# Patient Record
Sex: Male | Born: 1965 | Race: White | Hispanic: No | Marital: Married | Smoking: Former smoker
Health system: Southern US, Community
[De-identification: ages and names within clinical notes are randomized; demographics above are authoritative.]

## PROBLEM LIST (undated history)

## (undated) DIAGNOSIS — E119 Type 2 diabetes mellitus without complications: Secondary | ICD-10-CM

## (undated) DIAGNOSIS — K219 Gastro-esophageal reflux disease without esophagitis: Secondary | ICD-10-CM

## (undated) DIAGNOSIS — J189 Pneumonia, unspecified organism: Secondary | ICD-10-CM

## (undated) DIAGNOSIS — I10 Essential (primary) hypertension: Secondary | ICD-10-CM

## (undated) HISTORY — PX: ANTERIOR FUSION CERVICAL SPINE: SUR626

## (undated) HISTORY — PX: ROTATOR CUFF REPAIR: SHX139

## (undated) HISTORY — PX: COLONOSCOPY: SHX174

## (undated) HISTORY — PX: BACK SURGERY: SHX140

## (undated) HISTORY — PX: TONSILLECTOMY: SUR1361

## (undated) HISTORY — PX: ELBOW SURGERY: SHX618

---

## 2011-07-26 DIAGNOSIS — J189 Pneumonia, unspecified organism: Secondary | ICD-10-CM

## 2011-07-26 HISTORY — DX: Pneumonia, unspecified organism: J18.9

## 2014-04-09 ENCOUNTER — Ambulatory Visit: Payer: Self-pay | Admitting: Physician Assistant

## 2015-08-31 ENCOUNTER — Other Ambulatory Visit: Payer: Self-pay | Admitting: Physician Assistant

## 2015-08-31 NOTE — H&P (Addendum)
This is a 50 year-old gentleman who presents to our clinic today for follow up of his left shoulder.  Lee Kramer has been experiencing marked pain to the left shoulder for the past six weeks.  We have worked him up for rotator cuff repair with an MR arthrogram.  This showed no obvious gross destruction of his cuff, however there was one film that looked like he may have a small leak of fluid through a potentially small hole in his rotator cuff.  Lee Kramer has failed conservative treatment options to include oral anti-inflammatories, subacromial injection, as well as a home exercise program.  He is ready to proceed with definitive treatment options. Past medical, social and family history reviewed in detail on the patient questionnaire and signed.  Review of systems: As detailed in HPI.  All others reviewed and are negative.   EXAMINATION: Well-developed, well-nourished male in no acute distress.  Lungs clear to auscultation bilaterally.  Heart sounds normal.  Alert and oriented x 3.  Examination of his left shoulder reveals full active range of motion with forward flexion.  Limited external and internal rotation.  He is neurovascularly intact distally.    IMPRESSION: Left shoulder impingement syndrome.   PLAN: At this point Lee Kramer has tried all conservative treatment options which have all failed.  He would like to proceed with definitive treatment to include a left shoulder arthroscopic decompression and possible rotator cuff repair.  Risks, benefits and possible complications of surgery have been reviewed.  Rehab and recovery time discussed. All questions answered.  Paperwork complete.    Loreta Ave, M.D.

## 2015-09-03 ENCOUNTER — Encounter (HOSPITAL_BASED_OUTPATIENT_CLINIC_OR_DEPARTMENT_OTHER): Payer: Self-pay | Admitting: *Deleted

## 2015-09-07 ENCOUNTER — Other Ambulatory Visit: Payer: Self-pay

## 2015-09-07 ENCOUNTER — Encounter (HOSPITAL_BASED_OUTPATIENT_CLINIC_OR_DEPARTMENT_OTHER)
Admission: RE | Admit: 2015-09-07 | Discharge: 2015-09-07 | Disposition: A | Discharge status: Home / Self Care | Payer: 59 | Source: Ambulatory Visit | Attending: Orthopedic Surgery | Admitting: Orthopedic Surgery

## 2015-09-07 DIAGNOSIS — Z7982 Long term (current) use of aspirin: Secondary | ICD-10-CM | POA: Diagnosis not present

## 2015-09-07 DIAGNOSIS — Z79899 Other long term (current) drug therapy: Secondary | ICD-10-CM | POA: Diagnosis not present

## 2015-09-07 DIAGNOSIS — K219 Gastro-esophageal reflux disease without esophagitis: Secondary | ICD-10-CM | POA: Diagnosis not present

## 2015-09-07 DIAGNOSIS — M24112 Other articular cartilage disorders, left shoulder: Secondary | ICD-10-CM | POA: Diagnosis not present

## 2015-09-07 DIAGNOSIS — E119 Type 2 diabetes mellitus without complications: Secondary | ICD-10-CM | POA: Diagnosis not present

## 2015-09-07 DIAGNOSIS — I10 Essential (primary) hypertension: Secondary | ICD-10-CM | POA: Diagnosis not present

## 2015-09-07 DIAGNOSIS — M7542 Impingement syndrome of left shoulder: Secondary | ICD-10-CM | POA: Diagnosis not present

## 2015-09-07 DIAGNOSIS — M19012 Primary osteoarthritis, left shoulder: Secondary | ICD-10-CM | POA: Diagnosis present

## 2015-09-07 DIAGNOSIS — M7502 Adhesive capsulitis of left shoulder: Secondary | ICD-10-CM | POA: Diagnosis not present

## 2015-09-07 LAB — BASIC METABOLIC PANEL
Anion gap: 13 (ref 5–15)
BUN: 13 mg/dL (ref 6–20)
CALCIUM: 9.4 mg/dL (ref 8.9–10.3)
CO2: 24 mmol/L (ref 22–32)
CREATININE: 1.01 mg/dL (ref 0.61–1.24)
Chloride: 102 mmol/L (ref 101–111)
GFR calc Af Amer: >60 mL/min (ref >60)
Glucose, Bld: 122 mg/dL — ABNORMAL HIGH (ref 65–99)
POTASSIUM: 4.7 mmol/L (ref 3.5–5.1)
SODIUM: 139 mmol/L (ref 135–145)

## 2015-09-10 ENCOUNTER — Ambulatory Visit (HOSPITAL_BASED_OUTPATIENT_CLINIC_OR_DEPARTMENT_OTHER): Payer: 59 | Admitting: Certified Registered"

## 2015-09-10 ENCOUNTER — Encounter (HOSPITAL_BASED_OUTPATIENT_CLINIC_OR_DEPARTMENT_OTHER)
Admission: RE | Disposition: A | Discharge status: Home / Self Care | Payer: Self-pay | Source: Ambulatory Visit | Attending: Orthopedic Surgery

## 2015-09-10 ENCOUNTER — Ambulatory Visit (HOSPITAL_BASED_OUTPATIENT_CLINIC_OR_DEPARTMENT_OTHER)
Admission: RE | Admit: 2015-09-10 | Discharge: 2015-09-10 | Disposition: A | Discharge status: Home / Self Care | Payer: 59 | Source: Ambulatory Visit | Attending: Orthopedic Surgery | Admitting: Orthopedic Surgery

## 2015-09-10 ENCOUNTER — Encounter (HOSPITAL_BASED_OUTPATIENT_CLINIC_OR_DEPARTMENT_OTHER): Payer: Self-pay

## 2015-09-10 DIAGNOSIS — Z7982 Long term (current) use of aspirin: Secondary | ICD-10-CM | POA: Insufficient documentation

## 2015-09-10 DIAGNOSIS — M7502 Adhesive capsulitis of left shoulder: Secondary | ICD-10-CM | POA: Insufficient documentation

## 2015-09-10 DIAGNOSIS — E119 Type 2 diabetes mellitus without complications: Secondary | ICD-10-CM | POA: Insufficient documentation

## 2015-09-10 DIAGNOSIS — M19012 Primary osteoarthritis, left shoulder: Secondary | ICD-10-CM | POA: Insufficient documentation

## 2015-09-10 DIAGNOSIS — Z79899 Other long term (current) drug therapy: Secondary | ICD-10-CM | POA: Insufficient documentation

## 2015-09-10 DIAGNOSIS — M7542 Impingement syndrome of left shoulder: Secondary | ICD-10-CM | POA: Insufficient documentation

## 2015-09-10 DIAGNOSIS — K219 Gastro-esophageal reflux disease without esophagitis: Secondary | ICD-10-CM | POA: Insufficient documentation

## 2015-09-10 DIAGNOSIS — I10 Essential (primary) hypertension: Secondary | ICD-10-CM | POA: Insufficient documentation

## 2015-09-10 DIAGNOSIS — M24112 Other articular cartilage disorders, left shoulder: Secondary | ICD-10-CM | POA: Insufficient documentation

## 2015-09-10 HISTORY — DX: Gastro-esophageal reflux disease without esophagitis: K21.9

## 2015-09-10 HISTORY — DX: Type 2 diabetes mellitus without complications: E11.9

## 2015-09-10 HISTORY — PX: SHOULDER ARTHROSCOPY WITH DISTAL CLAVICLE RESECTION: SHX5675

## 2015-09-10 HISTORY — DX: Essential (primary) hypertension: I10

## 2015-09-10 LAB — GLUCOSE, CAPILLARY
GLUCOSE-CAPILLARY: 116 mg/dL — AB (ref 65–99)
GLUCOSE-CAPILLARY: 98 mg/dL (ref 65–99)

## 2015-09-10 SURGERY — SHOULDER ARTHROSCOPY WITH DISTAL CLAVICLE RESECTION
Anesthesia: Regional | Site: Shoulder | Laterality: Left

## 2015-09-10 MED ORDER — ONDANSETRON HCL 4 MG/2ML IJ SOLN
INTRAMUSCULAR | Status: DC | PRN
  Administered 2015-09-10: 4 mg via INTRAVENOUS

## 2015-09-10 MED ORDER — OXYCODONE-ACETAMINOPHEN 5-325 MG PO TABS: 1.0000 | ORAL_TABLET | ORAL | Status: DC | PRN

## 2015-09-10 MED ORDER — MIDAZOLAM HCL 2 MG/2ML IJ SOLN
INTRAMUSCULAR | Status: AC
  Filled 2015-09-10: qty 2

## 2015-09-10 MED ORDER — FENTANYL CITRATE (PF) 100 MCG/2ML IJ SOLN
INTRAMUSCULAR | Status: AC
  Filled 2015-09-10: qty 2

## 2015-09-10 MED ORDER — DEXAMETHASONE SODIUM PHOSPHATE 4 MG/ML IJ SOLN
INTRAMUSCULAR | Status: DC | PRN
  Administered 2015-09-10: 10 mg via INTRAVENOUS

## 2015-09-10 MED ORDER — HYDROMORPHONE HCL 1 MG/ML IJ SOLN
INTRAMUSCULAR | Status: AC
  Filled 2015-09-10: qty 1

## 2015-09-10 MED ORDER — DEXAMETHASONE SODIUM PHOSPHATE 10 MG/ML IJ SOLN
INTRAMUSCULAR | Status: AC
Start: 2015-09-10 — End: 2015-09-10
  Filled 2015-09-10: qty 1

## 2015-09-10 MED ORDER — HYDROMORPHONE HCL 1 MG/ML IJ SOLN
0.2500 mg | INTRAMUSCULAR | Status: DC | PRN
  Administered 2015-09-10 (×2): 0.5 mg via INTRAVENOUS

## 2015-09-10 MED ORDER — LIDOCAINE HCL (CARDIAC) 20 MG/ML IV SOLN
INTRAVENOUS | Status: AC
  Filled 2015-09-10: qty 5

## 2015-09-10 MED ORDER — SCOPOLAMINE 1 MG/3DAYS TD PT72: 1.0000 | MEDICATED_PATCH | Freq: Once | TRANSDERMAL | Status: DC | PRN

## 2015-09-10 MED ORDER — SUCCINYLCHOLINE CHLORIDE 20 MG/ML IJ SOLN
INTRAMUSCULAR | Status: DC | PRN
  Administered 2015-09-10: 100 mg via INTRAVENOUS

## 2015-09-10 MED ORDER — ONDANSETRON HCL 4 MG/2ML IJ SOLN
INTRAMUSCULAR | Status: AC
  Filled 2015-09-10: qty 2

## 2015-09-10 MED ORDER — EPHEDRINE SULFATE 50 MG/ML IJ SOLN
INTRAMUSCULAR | Status: DC | PRN
  Administered 2015-09-10: 15 mg via INTRAVENOUS
  Administered 2015-09-10: 10 mg via INTRAVENOUS

## 2015-09-10 MED ORDER — LACTATED RINGERS IV SOLN: INTRAVENOUS | Status: DC

## 2015-09-10 MED ORDER — LACTATED RINGERS IV SOLN
INTRAVENOUS | Status: DC
  Administered 2015-09-10: 12:00:00 via INTRAVENOUS

## 2015-09-10 MED ORDER — EPHEDRINE SULFATE 50 MG/ML IJ SOLN
INTRAMUSCULAR | Status: AC
  Filled 2015-09-10: qty 1

## 2015-09-10 MED ORDER — PROMETHAZINE HCL 25 MG/ML IJ SOLN: 6.2500 mg | INTRAMUSCULAR | Status: DC | PRN

## 2015-09-10 MED ORDER — CEFAZOLIN SODIUM-DEXTROSE 2-3 GM-% IV SOLR
INTRAVENOUS | Status: AC
  Filled 2015-09-10: qty 50

## 2015-09-10 MED ORDER — OXYCODONE HCL 5 MG PO TABS
ORAL_TABLET | ORAL | Status: AC
  Filled 2015-09-10: qty 1

## 2015-09-10 MED ORDER — GLYCOPYRROLATE 0.2 MG/ML IJ SOLN
0.2000 mg | Freq: Once | INTRAMUSCULAR | Status: DC | PRN
Start: 2015-09-10 — End: 2015-09-10

## 2015-09-10 MED ORDER — MIDAZOLAM HCL 2 MG/2ML IJ SOLN
1.0000 mg | INTRAMUSCULAR | Status: DC | PRN
  Administered 2015-09-10 (×2): 2 mg via INTRAVENOUS

## 2015-09-10 MED ORDER — CEFAZOLIN SODIUM-DEXTROSE 2-3 GM-% IV SOLR
2.0000 g | INTRAVENOUS | Status: AC
  Administered 2015-09-10: 2 g via INTRAVENOUS

## 2015-09-10 MED ORDER — MEPERIDINE HCL 25 MG/ML IJ SOLN: 6.2500 mg | INTRAMUSCULAR | Status: DC | PRN

## 2015-09-10 MED ORDER — LIDOCAINE HCL (CARDIAC) 20 MG/ML IV SOLN
INTRAVENOUS | Status: DC | PRN
  Administered 2015-09-10: 80 mg via INTRAVENOUS

## 2015-09-10 MED ORDER — ONDANSETRON HCL 4 MG PO TABS: 4.0000 mg | ORAL_TABLET | Freq: Three times a day (TID) | ORAL | Status: DC | PRN

## 2015-09-10 MED ORDER — CHLORHEXIDINE GLUCONATE 4 % EX LIQD: 60.0000 mL | Freq: Once | CUTANEOUS | Status: DC

## 2015-09-10 MED ORDER — PROPOFOL 10 MG/ML IV BOLUS
INTRAVENOUS | Status: DC | PRN
  Administered 2015-09-10: 200 mg via INTRAVENOUS
  Administered 2015-09-10: 40 mg via INTRAVENOUS

## 2015-09-10 MED ORDER — FENTANYL CITRATE (PF) 100 MCG/2ML IJ SOLN
50.0000 ug | INTRAMUSCULAR | Status: AC | PRN
  Administered 2015-09-10: 100 ug via INTRAVENOUS
  Administered 2015-09-10: 50 ug via INTRAVENOUS
  Administered 2015-09-10: 100 ug via INTRAVENOUS

## 2015-09-10 MED ORDER — BUPIVACAINE-EPINEPHRINE (PF) 0.5% -1:200000 IJ SOLN
INTRAMUSCULAR | Status: DC | PRN
  Administered 2015-09-10: 20 mL via PERINEURAL

## 2015-09-10 MED ORDER — OXYCODONE HCL 5 MG PO TABS
5.0000 mg | ORAL_TABLET | Freq: Once | ORAL | Status: AC
  Administered 2015-09-10: 5 mg via ORAL

## 2015-09-10 SURGICAL SUPPLY — 70 items
BENZOIN TINCTURE PRP APPL 2/3 (GAUZE/BANDAGES/DRESSINGS) IMPLANT
BLADE CUTTER GATOR 3.5 (BLADE) ×4 IMPLANT
BLADE CUTTER MENIS 5.5 (BLADE) IMPLANT
BLADE GREAT WHITE 4.2 (BLADE) ×3 IMPLANT
BLADE GREAT WHITE 4.2MM (BLADE) ×1
BLADE SURG 15 STRL LF DISP TIS (BLADE) ×2 IMPLANT
BLADE SURG 15 STRL SS (BLADE) ×2
BUR OVAL 6.0 (BURR) ×4 IMPLANT
CANNULA DRY DOC 8X75 (CANNULA) IMPLANT
CANNULA TWIST IN 8.25X7CM (CANNULA) IMPLANT
CLOSURE WOUND 1/2 X4 (GAUZE/BANDAGES/DRESSINGS)
DECANTER SPIKE VIAL GLASS SM (MISCELLANEOUS) IMPLANT
DRAPE STERI 35X30 U-POUCH (DRAPES) ×4 IMPLANT
DRAPE U-SHAPE 47X51 STRL (DRAPES) ×4 IMPLANT
DRAPE U-SHAPE 76X120 STRL (DRAPES) ×8 IMPLANT
DRSG PAD ABDOMINAL 8X10 ST (GAUZE/BANDAGES/DRESSINGS) ×4 IMPLANT
DURAPREP 26ML APPLICATOR (WOUND CARE) ×4 IMPLANT
ELECT MENISCUS 165MM 90D (ELECTRODE) ×4 IMPLANT
ELECT REM PT RETURN 9FT ADLT (ELECTROSURGICAL) ×4
ELECTRODE REM PT RTRN 9FT ADLT (ELECTROSURGICAL) ×2 IMPLANT
GAUZE SPONGE 4X4 12PLY STRL (GAUZE/BANDAGES/DRESSINGS) ×8 IMPLANT
GAUZE XEROFORM 1X8 LF (GAUZE/BANDAGES/DRESSINGS) ×4 IMPLANT
GLOVE BIO SURGEON STRL SZ 6.5 (GLOVE) ×3 IMPLANT
GLOVE BIO SURGEONS STRL SZ 6.5 (GLOVE) ×1
GLOVE BIOGEL PI IND STRL 7.0 (GLOVE) ×6 IMPLANT
GLOVE BIOGEL PI INDICATOR 7.0 (GLOVE) ×6
GLOVE ECLIPSE 7.0 STRL STRAW (GLOVE) ×4 IMPLANT
GLOVE SURG ORTHO 8.0 STRL STRW (GLOVE) ×4 IMPLANT
GOWN STRL REUS W/ TWL LRG LVL3 (GOWN DISPOSABLE) ×4 IMPLANT
GOWN STRL REUS W/ TWL XL LVL3 (GOWN DISPOSABLE) ×2 IMPLANT
GOWN STRL REUS W/TWL LRG LVL3 (GOWN DISPOSABLE) ×4
GOWN STRL REUS W/TWL XL LVL3 (GOWN DISPOSABLE) ×2
IV NS IRRIG 3000ML ARTHROMATIC (IV SOLUTION) ×16 IMPLANT
MANIFOLD NEPTUNE II (INSTRUMENTS) ×4 IMPLANT
NDL SUT 6 .5 CRC .975X.05 MAYO (NEEDLE) IMPLANT
NEEDLE MAYO TAPER (NEEDLE)
NEEDLE SCORPION MULTI FIRE (NEEDLE) IMPLANT
NS IRRIG 1000ML POUR BTL (IV SOLUTION) IMPLANT
PACK ARTHROSCOPY DSU (CUSTOM PROCEDURE TRAY) ×4 IMPLANT
PASSER SUT SWANSON 36MM LOOP (INSTRUMENTS) IMPLANT
PENCIL BUTTON HOLSTER BLD 10FT (ELECTRODE) ×4 IMPLANT
SET ARTHROSCOPY TUBING (MISCELLANEOUS) ×2
SET ARTHROSCOPY TUBING LN (MISCELLANEOUS) ×2 IMPLANT
SLEEVE SCD COMPRESS KNEE MED (MISCELLANEOUS) IMPLANT
SLING ARM FOAM STRAP LRG (SOFTGOODS) IMPLANT
SLING ARM IMMOBILIZER LRG (SOFTGOODS) IMPLANT
SLING ARM IMMOBILIZER MED (SOFTGOODS) IMPLANT
SLING ARM MED ADULT FOAM STRAP (SOFTGOODS) IMPLANT
SLING ARM XL FOAM STRAP (SOFTGOODS) IMPLANT
SPONGE LAP 4X18 X RAY DECT (DISPOSABLE) IMPLANT
STRIP CLOSURE SKIN 1/2X4 (GAUZE/BANDAGES/DRESSINGS) IMPLANT
SUCTION FRAZIER HANDLE 10FR (MISCELLANEOUS)
SUCTION TUBE FRAZIER 10FR DISP (MISCELLANEOUS) IMPLANT
SUT ETHIBOND 2 OS 4 DA (SUTURE) IMPLANT
SUT ETHILON 2 0 FS 18 (SUTURE) IMPLANT
SUT ETHILON 3 0 PS 1 (SUTURE) IMPLANT
SUT FIBERWIRE #2 38 T-5 BLUE (SUTURE)
SUT RETRIEVER MED (INSTRUMENTS) IMPLANT
SUT TIGER TAPE 7 IN WHITE (SUTURE) IMPLANT
SUT VIC AB 0 CT1 27 (SUTURE)
SUT VIC AB 0 CT1 27XBRD ANBCTR (SUTURE) IMPLANT
SUT VIC AB 2-0 SH 27 (SUTURE)
SUT VIC AB 2-0 SH 27XBRD (SUTURE) IMPLANT
SUT VIC AB 3-0 FS2 27 (SUTURE) IMPLANT
SUTURE FIBERWR #2 38 T-5 BLUE (SUTURE) IMPLANT
TAPE FIBER 2MM 7IN #2 BLUE (SUTURE) IMPLANT
TOWEL OR 17X24 6PK STRL BLUE (TOWEL DISPOSABLE) ×4 IMPLANT
TOWEL OR NON WOVEN STRL DISP B (DISPOSABLE) ×4 IMPLANT
WATER STERILE IRR 1000ML POUR (IV SOLUTION) ×4 IMPLANT
YANKAUER SUCT BULB TIP NO VENT (SUCTIONS) IMPLANT

## 2015-09-10 NOTE — Anesthesia Preprocedure Evaluation (Addendum)
Anesthesia Evaluation  Patient identified by MRN, date of birth, ID band Patient awake    Reviewed: Allergy & Precautions, NPO status , Patient's Chart, lab work & pertinent test results  Airway Mallampati: II  TM Distance: >3 FB Neck ROM: Limited    Dental no notable dental hx.    Pulmonary neg pulmonary ROS,    Pulmonary exam normal breath sounds clear to auscultation       Cardiovascular hypertension, Pt. on medications Normal cardiovascular exam Rhythm:Regular Rate:Normal     Neuro/Psych  Neuromuscular disease negative psych ROS   GI/Hepatic Neg liver ROS, GERD  Medicated,  Endo/Other  diabetes, Type 2, Oral Hypoglycemic Agents  Renal/GU negative Renal ROS  negative genitourinary   Musculoskeletal negative musculoskeletal ROS (+)   Abdominal   Peds negative pediatric ROS (+)  Hematology negative hematology ROS (+)   Anesthesia Other Findings   Reproductive/Obstetrics negative OB ROS                             Anesthesia Physical Anesthesia Plan  ASA: II  Anesthesia Plan: General and Regional   Post-op Pain Management: GA combined w/ Regional for post-op pain   Induction: Intravenous  Airway Management Planned: Oral ETT  Additional Equipment:   Intra-op Plan:   Post-operative Plan: Extubation in OR  Informed Consent: I have reviewed the patients History and Physical, chart, labs and discussed the procedure including the risks, benefits and alternatives for the proposed anesthesia with the patient or authorized representative who has indicated his/her understanding and acceptance.   Dental advisory given  Plan Discussed with: CRNA  Anesthesia Plan Comments:         Anesthesia Quick Evaluation

## 2015-09-10 NOTE — Transfer of Care (Signed)
Immediate Anesthesia Transfer of Care Note  Patient: Lee Kramer  Procedure(s) Performed: Procedure(s): SHOULDER ARTHROSCOPY WITH DISTAL CLAVICLE RESECTION DEBRIDE LABRUM DEBRIDEMENT CALCIFIC ROTATOR CUFF (Left)  Patient Location: PACU  Anesthesia Type:General  Level of Consciousness: awake and sedated  Airway & Oxygen Therapy: Patient Spontanous Breathing and Patient connected to face mask oxygen  Post-op Assessment: Report given to RN and Post -op Vital signs reviewed and stable  Post vital signs: Reviewed and stable  Last Vitals:  Filed Vitals:   09/10/15 1225 09/10/15 1230  BP:    Pulse: 102 94  Temp:    Resp: 15 17    Complications: No apparent anesthesia complications

## 2015-09-10 NOTE — Interval H&P Note (Signed)
History and Physical Interval Note:  09/10/2015 7:34 AM  Lee Kramer  has presented today for surgery, with the diagnosis of LEFT SHOULDER OSTEOARTRITIS IMPINGEMENT SYNDROME ,STAIN OF MUSCLES AND TENDON OF THE  ROTATOR CUFF  The various methods of treatment have been discussed with the patient and family. After consideration of risks, benefits and other options for treatment, the patient has consented to  Procedure(s): LEFT SHOULDER ARTHROSCOPY DEBRIDMENT, DISTAL CLAVICAL EXCISION, ACROMIOPLASTY,ROTATOR CUFF REPAIR,BICEPS TENODESIS  (Left) as a surgical intervention .  The patient's history has been reviewed, patient examined, no change in status, stable for surgery.  I have reviewed the patient's chart and labs.  Questions were answered to the patient's satisfaction.     Loreta Ave

## 2015-09-10 NOTE — Discharge Instructions (Signed)
Shouder arthroscopy, partial rotator cuff tear debridement subacromial decompression °Care After Instructions °Refer to this sheet in the next few weeks. These discharge instructions provide you with general information on caring for yourself after you leave the hospital. Your caregiver may also give you specific instructions. Your treatment has been planned according to the most current medical practices available, but unavoidable complications sometimes occur. If you have any problems or questions after discharge, please call your caregiver. °HOME INSTRUCTIONS °You may resume a normal diet and activities as directed. Take showers instead of baths until informed otherwise.  °Change bandages (dressings) in 3 days.  Swab wounds daily with betadine.  Wash shoulder with soap and water.  Pat dry.  Cover wounds with bandaids. °Only take over-the-counter or prescription medicines for pain, discomfort, or fever as directed by your caregiver.  °Wear your sling for the next 2 days unless otherwise instructed. °Eat a well-balanced diet.  °Avoid lifting or driving until you are instructed otherwise.  °Make an appointment to see your caregiver for stitches (suture) or staple removal one week after surgery. ° °SEEK MEDICAL CARE IF: °You have swelling of your calf or leg.  °You develop shortness of breath or chest pain.  °You have redness, swelling, or increasing pain in the wound.  °There is pus or any unusual drainage coming from the surgical site.  °You notice a bad smell coming from the surgical site or dressing.  °The surgical site breaks open after sutures or staples have been removed.  °There is persistent bleeding from the suture or staple line.  °You are getting worse or are not improving.  °You have any other questions or concerns.  °SEEK IMMEDIATE MEDICAL CARE IF:  °You have a fever greater than 101 °You develop a rash.  °You have difficulty breathing.  °You develop any reaction or side effects to medicines given.    °Your knee motion is decreasing rather than improving.  °MAKE SURE YOU:  °Understand these instructions.  °Will watch your condition.  °Will get help right away if you are not doing well or get worse.  ° °Post Anesthesia Home Care Instructions ° °Activity: °Get plenty of rest for the remainder of the day. A responsible adult should stay with you for 24 hours following the procedure.  °For the next 24 hours, DO NOT: °-Drive a car °-Operate machinery °-Drink alcoholic beverages °-Take any medication unless instructed by your physician °-Make any legal decisions or sign important papers. ° °Meals: °Start with liquid foods such as gelatin or soup. Progress to regular foods as tolerated. Avoid greasy, spicy, heavy foods. If nausea and/or vomiting occur, drink only clear liquids until the nausea and/or vomiting subsides. Call your physician if vomiting continues. ° °Special Instructions/Symptoms: °Your throat may feel dry or sore from the anesthesia or the breathing tube placed in your throat during surgery. If this causes discomfort, gargle with warm salt water. The discomfort should disappear within 24 hours. ° °If you had a scopolamine patch placed behind your ear for the management of post- operative nausea and/or vomiting: ° °1. The medication in the patch is effective for 72 hours, after which it should be removed.  Wrap patch in a tissue and discard in the trash. Wash hands thoroughly with soap and water. °2. You may remove the patch earlier than 72 hours if you experience unpleasant side effects which may include dry mouth, dizziness or visual disturbances. °3. Avoid touching the patch. Wash your hands with soap and water after contact with   the patch. ° ° °  °Regional Anesthesia Blocks ° °1. Numbness or the inability to move the "blocked" extremity may last from 3-48 hours after placement. The length of time depends on the medication injected and your individual response to the medication. If the numbness is not  going away after 48 hours, call your surgeon. ° °2. The extremity that is blocked will need to be protected until the numbness is gone and the  Strength has returned. Because you cannot feel it, you will need to take extra care to avoid injury. Because it may be weak, you may have difficulty moving it or using it. You may not know what position it is in without looking at it while the block is in effect. ° °3. For blocks in the legs and feet, returning to weight bearing and walking needs to be done carefully. You will need to wait until the numbness is entirely gone and the strength has returned. You should be able to move your leg and foot normally before you try and bear weight or walk. You will need someone to be with you when you first try to ensure you do not fall and possibly risk injury. ° °4. Bruising and tenderness at the needle site are common side effects and will resolve in a few days. ° °5. Persistent numbness or new problems with movement should be communicated to the surgeon or the [facility] ([phone])/ [facility] ([phone]). °

## 2015-09-10 NOTE — Anesthesia Procedure Notes (Addendum)
Anesthesia Regional Block:  Supraclavicular block  Pre-Anesthetic Checklist: ,, timeout performed, Correct Patient, Correct Site, Correct Laterality, Correct Procedure, Correct Position, site marked, Risks and benefits discussed,  Surgical consent,  Pre-op evaluation,  At surgeon's request and post-op pain management  Laterality: Left and Upper  Prep: Dura Prep       Needles:  Injection technique: Single-shot  Needle Type: Echogenic Stimulator Needle     Needle Length: 9cm 9 cm Needle Gauge: 21 and 21 G    Additional Needles:  Procedures: ultrasound guided (picture in chart) and nerve stimulator Supraclavicular block Narrative:  Injection made incrementally with aspirations every 5 mL.  Performed by: Personally   Additional Notes: Risks, benefits and alternative to block explained extensively.  Patient tolerated procedure well, without complications.   Procedure Name: Intubation Performed by: York Grice Pre-anesthesia Checklist: Patient identified, Emergency Drugs available, Suction available and Patient being monitored Patient Re-evaluated:Patient Re-evaluated prior to inductionOxygen Delivery Method: Circle System Utilized Preoxygenation: Pre-oxygenation with 100% oxygen Intubation Type: IV induction Ventilation: Mask ventilation without difficulty Laryngoscope Size: Miller and 2 Grade View: Grade II Tube type: Oral Tube size: 7.0 mm Number of attempts: 1 Airway Equipment and Method: Stylet and Oral airway Placement Confirmation: ETT inserted through vocal cords under direct vision,  positive ETCO2 and breath sounds checked- equal and bilateral Secured at: 22 cm Tube secured with: Tape Dental Injury: Teeth and Oropharynx as per pre-operative assessment

## 2015-09-10 NOTE — Anesthesia Postprocedure Evaluation (Signed)
Anesthesia Post Note  Patient: Lee Kramer  Procedure(s) Performed: Procedure(s) (LRB): SHOULDER ARTHROSCOPY WITH DISTAL CLAVICLE RESECTION DEBRIDE LABRUM DEBRIDEMENT CALCIFIC ROTATOR CUFF (Left)  Patient location during evaluation: PACU Anesthesia Type: General and Regional Level of consciousness: awake and alert Pain management: pain level controlled Vital Signs Assessment: post-procedure vital signs reviewed and stable Respiratory status: spontaneous breathing, nonlabored ventilation, respiratory function stable and patient connected to nasal cannula oxygen Cardiovascular status: blood pressure returned to baseline and stable Postop Assessment: no signs of nausea or vomiting Anesthetic complications: no    Last Vitals:  Filed Vitals:   09/10/15 1430 09/10/15 1445  BP: 113/77 115/64  Pulse: 104 98  Temp:    Resp: 21 17    Last Pain:  Filed Vitals:   09/10/15 1455  PainSc: 4                  Phillips Grout

## 2015-09-11 ENCOUNTER — Encounter (HOSPITAL_BASED_OUTPATIENT_CLINIC_OR_DEPARTMENT_OTHER): Payer: Self-pay | Admitting: Orthopedic Surgery

## 2015-09-11 NOTE — Op Note (Signed)
Lee Kramer, Lee Kramer                ACCOUNT NO.:  [id number]  MEDICAL RECORD NO.:  [id number]  LOCATION:                                 FACILITY:  PHYSICIAN:  Loreta Ave, M.D. DATE OF BIRTH:  1966/03/04  DATE OF PROCEDURE:  09/10/2015 DATE OF DISCHARGE:                              OPERATIVE REPORT   PREOPERATIVE DIAGNOSES:  Left shoulder recurrent impingement after remote rotator cuff repair done in a mini-open manner 20 years ago. Degenerative joint disease, acromioclavicular joint.  POSTOPERATIVE DIAGNOSES:  Left shoulder recurrent impingement after remote rotator cuff repair done in a mini-open manner 20 years ago. Degenerative joint disease, acromioclavicular joint without recurrent full-thickness tear, but calcific deposits on the top of rotator cuff. Degenerative tearing superior labrum.  Marked recurrent subacromial spurring and grade 4 changes to acromioclavicular joint.  Reactive adhesive bursitis subacromially.  PROCEDURE:  Left shoulder exam under anesthesia, arthroscopy. Debridement of labrum.  Debridement of rotator cuff above and below excising calcific deposits.  Bursectomy, lysis of adhesions. Acromioplasty, CA ligament release.  Excision of distal clavicle.  SURGEON:  Loreta Ave, MD  ASSISTANT:  Mikey Kirschner, PA, present throughout the entire case and necessary for timely completion of procedure.  ANESTHESIA:  General.  BLOOD LOSS:  Minimal.  SPECIMENS:  None.  CULTURES:  None.  COMPLICATIONS:  None.  DRESSINGS:  Soft compressive with sling.  PROCEDURE IN DETAIL:  The patient was brought to operating room, placed on the operating table in supine position.  After adequate anesthesia had been obtained, shoulder examined.  Full motion with stable shoulder. Placed in beach-chair position on the shoulder positioner, prepped and draped in usual sterile fashion.  Three portals anterior, posterior, and lateral.  Arthroscope introduced,  shoulder distended and inspected. Complex tearing, top of the labrum debrided.  Articular cartilage, biceps tendon, biceps anchor intact.  A lot of tendinopathy, partial tearing, degenerative change supraspinatus throughout the crescent region.  This was debrided.  No full-thickness tears.  Cannula redirected subacromially.  A lot of adhesions bursitis resected. Although, a lot of tendinopathy on the top of the cuff, there were no full-thickness tears.  Calcific deposits were debrided from the top of the cuff.  Marked recurrent spurring with a spike opted for the acromion.  Revision acromioplasty to a type 1 acromion releasing the CA ligament.  Distal clavicle grade 4 changes with significant spurring. Periarticular spurs, lateral centimeter of clavicle resected.  After completing decompression, I looked to the adequacy from all portals. This was found to be adequate.  I also then thoroughly inspected the cuff from all angles and again there was no indication to do more formal repair.  Instruments and fluid were removed.  Portals were closed with nylon.  Sterile compressive dressing applied.  Sling applied. Anesthesia reversed.  Brought to the recovery room.  Tolerated the surgery well.  No complications.     Loreta Ave, M.D.     DFM/MEDQ  D:  09/10/2015  T:  09/10/2015  Job:  [id number]

## 2016-06-03 ENCOUNTER — Encounter: Payer: Self-pay | Admitting: Gynecology

## 2016-06-03 ENCOUNTER — Ambulatory Visit
Admission: EM | Admit: 2016-06-03 | Discharge: 2016-06-03 | Disposition: A | Discharge status: Home / Self Care | Payer: 59 | Attending: Family Medicine | Admitting: Family Medicine

## 2016-06-03 DIAGNOSIS — L509 Urticaria, unspecified: Secondary | ICD-10-CM | POA: Diagnosis not present

## 2016-06-03 MED ORDER — FAMOTIDINE 20 MG PO TABS
20.0000 mg | ORAL_TABLET | Freq: Once | ORAL | Status: AC
  Administered 2016-06-03: 20 mg via ORAL

## 2016-06-03 MED ORDER — HYDROXYZINE HCL 25 MG PO TABS: ORAL_TABLET | ORAL | 0 refills | Status: DC

## 2016-06-03 NOTE — ED Provider Notes (Signed)
CSN: [id number]     Arrival date & time 06/03/16  1514 History   First MD Initiated Contact with Patient 06/03/16 1720     Chief Complaint  Patient presents with  . Rash   (Consider location/radiation/quality/duration/timing/severity/associated sxs/prior Treatment) HPI  This is a 50 year old male presents with a rash that he has had over 2 weeks. He states that he saw his primary care physician 2 weeks ago started on oral prednisone which she finished and states that it did not seem to help a topical steroid that he used on his facial rash after a week seem to help clear . Now he is having more severe itching which is continuous and a rash that seems to be involving most of his body. Has a hive appearance. it is interfering greatly with his sleep. He estimates he's only had 2 hours of sleep in the last couple of days because of the severe itching. No respiratory involvement. Is on lisinopril but has been taking that for at least 4 years without incident.      Past Medical History:  Diagnosis Date  . Diabetes mellitus without complication (HCC)   . GERD (gastroesophageal reflux disease)   . Hypertension   . Neuromuscular disorder [facility](HCC)    Past Surgical History:  Procedure Laterality Date  . ANTERIOR FUSION CERVICAL SPINE    . BACK SURGERY    . ELBOW SURGERY    . ROTATOR CUFF REPAIR    . SHOULDER ARTHROSCOPY WITH DISTAL CLAVICLE RESECTION Left 09/10/2015   Procedure: SHOULDER ARTHROSCOPY WITH DISTAL CLAVICLE RESECTION DEBRIDE LABRUM DEBRIDEMENT CALCIFIC ROTATOR CUFF;  Surgeon: Loreta Aveaniel F Murphy, MD;  Location: [facility];  Service: Orthopedics;  Laterality: Left;   Family History  Problem Relation Age of Onset  . Adopted: Yes   Social History  Substance Use Topics  . Smoking status: Never Smoker  . Smokeless tobacco: Never Used  . Alcohol use Yes     Comment: occas    Review of Systems  Constitutional: Negative for chills, fatigue and fever.  Skin: Positive for  rash.  All other systems reviewed and are negative.   Allergies  Patient has no known allergies.  Home Medications   Prior to Admission medications   Medication Sig Start Date End Date Taking? Authorizing Provider  aspirin 81 MG tablet Take 81 mg by mouth daily.   Yes Historical Provider, MD  atorvastatin (LIPITOR) 10 MG tablet Take 10 mg by mouth daily.   Yes Historical Provider, MD  lisinopril (PRINIVIL,ZESTRIL) 20 MG tablet Take 20 mg by mouth daily.   Yes Historical Provider, MD  metFORMIN (GLUCOPHAGE) 500 MG tablet Take by mouth 2 (two) times daily with a meal.   Yes Historical Provider, MD  omeprazole (PRILOSEC) 20 MG capsule Take 20 mg by mouth daily.   Yes Historical Provider, MD  hydrOXYzine (ATARAX/VISTARIL) 25 MG tablet Take 1-2 tablets times a day as necessary for severe itching 06/03/16   Lutricia FeilWilliam P Landry Kamath, PA-C  ondansetron (ZOFRAN) 4 MG tablet Take 1 tablet (4 mg total) by mouth every 8 (eight) hours as needed for nausea or vomiting. 09/10/15   Cristie HemMary L Stanbery, PA-C  oxyCODONE-acetaminophen (ROXICET) 5-325 MG tablet Take 1-2 tablets by mouth every 4 (four) hours as needed. 09/10/15   Cristie HemMary L Stanbery, PA-C   Meds Ordered and Administered this Visit   Medications  famotidine (PEPCID) tablet 20 mg (20 mg Oral Given 06/03/16 1748)    BP (!) 136/93 (BP Location: Left Arm)  Pulse 90   Temp 98.5 F (36.9 C) (Oral)   Ht 6\' 1"  (1.854 m)   Wt 225 lb (102.1 kg)   SpO2 97%   BMI 29.69 kg/m  No data found.   Physical Exam  Constitutional: He is oriented to person, place, and time. He appears well-developed and well-nourished. No distress.  HENT:  Head: Normocephalic and atraumatic.  Eyes: EOM are normal. Pupils are equal, round, and reactive to light.  Neck: Normal range of motion. Neck supple.  Pulmonary/Chest: Effort normal and breath sounds normal. No respiratory distress. He has no wheezes. He has no rales.  Musculoskeletal: Normal range of motion.  Neurological: He  is alert and oriented to person, place, and time.  Skin: Skin is warm and dry. Rash noted. He is not diaphoretic.  Examination of the skin shows the patient to be itching the entire exam. THere blanchable large patches of erythematous hives  present over his abdomen back arms legs and over the upper inner calves. No vesicles are present. Is have numerous excoriations from itching.  Psychiatric: He has a normal mood and affect. His behavior is normal. Judgment and thought content normal.  Nursing note and vitals reviewed.   Urgent Care Course   Clinical Course     Procedures (including critical care time)  Labs Review Labs Reviewed - No data to display  Imaging Review No results found.   Visual Acuity Review  Right Eye Distance:   Left Eye Distance:   Bilateral Distance:    Right Eye Near:   Left Eye Near:    Bilateral Near:     Medications  famotidine (PEPCID) tablet 20 mg (20 mg Oral Given 06/03/16 1748)      MDM   1. Urticaria    Discharge Medication List as of 06/03/2016  5:50 PM    START taking these medications   Details  hydrOXYzine (ATARAX/VISTARIL) 25 MG tablet Take 1-2 tablets times a day as necessary for severe itching, Print      Plan: 1. Test/x-ray results and diagnosis reviewed with patient 2. rx as per orders; risks, benefits, potential side effects reviewed with patient 3. Recommend supportive treatment with Use of Pepcid and Zyrtec until resolution of his symptoms. He was given a prescription for Vistaril ; take 25-50 mg 4 times a day as necessary for severe itching. Is already had a full course of prednisone which did not help. He has an established relationship with Dr. Ebony CargoBenitez Graham and will call her next week for an appointment. I have asked him to think back and any changes that he may have made in regards to food or medications. Also advised him he should stay away from aspirin and nonsteroidal anti-inflammatories.  4. F/u prn if symptoms  worsen or don't improve     Lutricia FeilWilliam P Iniko Robles, PA-C 06/03/16 1816

## 2016-06-03 NOTE — ED Triage Notes (Signed)
Patient c/o trash x over 2 weeks. Patient stated saw PCP x 2 weeks ago and was given prednisone. Per patient finish medication and rash is now worse.

## 2016-06-03 NOTE — Discharge Instructions (Signed)
Take Pepcid 20 mg twice a day along with Zyrtec twice daily. Follow-up up with dermatologist next week.

## 2017-12-20 ENCOUNTER — Other Ambulatory Visit: Payer: Self-pay | Admitting: Neurosurgery

## 2017-12-20 DIAGNOSIS — M5412 Radiculopathy, cervical region: Secondary | ICD-10-CM

## 2017-12-29 ENCOUNTER — Other Ambulatory Visit: Payer: 59

## 2018-01-05 ENCOUNTER — Ambulatory Visit
Admission: RE | Admit: 2018-01-05 | Discharge: 2018-01-05 | Disposition: A | Discharge status: Home / Self Care | Payer: 59 | Source: Ambulatory Visit | Attending: Neurosurgery | Admitting: Neurosurgery

## 2018-01-05 DIAGNOSIS — M5412 Radiculopathy, cervical region: Secondary | ICD-10-CM

## 2018-01-05 MED ORDER — HYDROXYZINE HCL 50 MG/ML IM SOLN
25.0000 mg | Freq: Once | INTRAMUSCULAR | Status: AC
  Administered 2018-01-05: 25 mg via INTRAMUSCULAR

## 2018-01-05 MED ORDER — MEPERIDINE HCL 100 MG/ML IJ SOLN
75.0000 mg | Freq: Once | INTRAMUSCULAR | Status: AC
  Administered 2018-01-05: 75 mg via INTRAMUSCULAR

## 2018-01-05 MED ORDER — DIAZEPAM 5 MG PO TABS
10.0000 mg | ORAL_TABLET | Freq: Once | ORAL | Status: AC
  Administered 2018-01-05: 10 mg via ORAL

## 2018-01-05 MED ORDER — IOPAMIDOL (ISOVUE-M 300) INJECTION 61%
10.0000 mL | Freq: Once | INTRAMUSCULAR | Status: AC | PRN
  Administered 2018-01-05: 10 mL via INTRATHECAL

## 2018-01-05 NOTE — Discharge Instructions (Signed)

## 2018-01-22 ENCOUNTER — Other Ambulatory Visit: Payer: Self-pay | Admitting: Neurosurgery

## 2018-01-23 ENCOUNTER — Other Ambulatory Visit: Payer: Self-pay | Admitting: Neurosurgery

## 2018-01-29 NOTE — Pre-Procedure Instructions (Signed)
Lee Kramer  01/29/2018    Your procedure is scheduled on Monday, February 05, 2018 at 11:10 AM.   Report to [facility] Entrance "A" Admitting Office at 9:10 AM.   Call this number if you have problems the morning of surgery: [phone]   Questions prior to day of surgery, please call [phone] between 8 & 4 PM.   Remember:  Do not eat or drink after midnight Sunday, 02/04/18.  Take these medicines the morning of surgery with A SIP OF WATER: Omeprazole (Prilosec), Tylenol - if needed, nasal spray - if needed  Stop Zyrtec D and Multivitamins as of today. Stop Aspirin as instructed by physician/surgeon. Do not use NSAIDS (Ibuprofen, Aleve, etc) prior to surgery.   How to Manage Your Diabetes Before Surgery   Why is it important to control my blood sugar before and after surgery?   Improving blood sugar levels before and after surgery helps healing and can limit problems.  A way of improving blood sugar control is eating a healthy diet by:  - Eating less sugar and carbohydrates  - Increasing activity/exercise  - Talk with your doctor about reaching your blood sugar goals  High blood sugars (greater than 180 mg/dL) can raise your risk of infections and slow down your recovery so you will need to focus on controlling your diabetes during the weeks before surgery.  Make sure that the doctor who takes care of your diabetes knows about your planned surgery including the date and location.  How do I manage my blood sugars before surgery?   Check your blood sugar at least 4 times a day, 2 days before surgery to make sure that they are not too high or low.  Check your blood sugar the morning of your surgery when you wake up and every 2 hours until you get to the Short-Stay unit.  Treat a low blood sugar (less than 70 mg/dL) with 1/2 cup of clear juice (cranberry or apple), 4 glucose tablets, OR glucose gel.  Recheck blood sugar in 15 minutes after treatment (to make  sure it is greater than 70 mg/dL).  If blood sugar is not greater than 70 mg/dL on re-check, call [phone] for further instructions.   Report your blood sugar to the Short-Stay nurse when you get to Short-Stay.  References:  [facility], 2007 "How to Manage your Diabetes Before and After Surgery".    Do not wear jewelry.  Do not wear lotions, powders, cologne or deodorant.  Men may shave face and neck.  Do not bring valuables to the hospital.  [facility] is not responsible for any belongings or valuables.  Contacts, dentures or bridgework may not be worn into surgery.  Leave your suitcase in the car.  After surgery it may be brought to your room.  For patients admitted to the hospital, discharge time will be determined by your treatment team.  Patients discharged the day of surgery will not be allowed to drive home.   [facility] - Preparing for Surgery  Before surgery, you can play an important role.  Because skin is not sterile, your skin needs to be as free of germs as possible.  You can reduce the number of germs on you skin by washing with CHG (chlorahexidine gluconate) soap before surgery.  CHG is an antiseptic cleaner which kills germs and bonds with the skin to continue killing germs even after washing.  Oral Hygiene is also important in reducing the  risk of infection.  Remember to brush your teeth with your regular toothpaste the morning of surgery.  Please DO NOT use if you have an allergy to CHG or antibacterial soaps.  If your skin becomes reddened/irritated stop using the CHG and inform your nurse when you arrive at Short Stay.  Do not shave (including legs and underarms) for at least 48 hours prior to the first CHG shower.  You may shave your face.  Please follow these instructions carefully:   1.  Shower with CHG Soap the night before surgery and the morning of Surgery.  2.  If you choose to wash your hair, wash your hair first as usual  with your normal shampoo.  3.  After you shampoo, rinse your hair and body thoroughly to remove the shampoo. 4.  Use CHG as you would any other liquid soap.  You can apply chg directly to the skin and wash gently with a      scrungie or washcloth.           5.  Apply the CHG Soap to your body ONLY FROM THE NECK DOWN.   Do not use on open wounds or open sores. Avoid contact with your eyes, ears, mouth and genitals (private parts).  Wash genitals (private parts) with your normal soap.  6.  Wash thoroughly, paying special attention to the area where your surgery will be performed.  7.  Thoroughly rinse your body with warm water from the neck down.  8.  DO NOT shower/wash with your normal soap after using and rinsing off the CHG Soap.  9.  Pat yourself dry with a clean towel.            10.  Wear clean pajamas.            11.  Place clean sheets on your bed the night of your first shower and do not sleep with pets.  Day of Surgery  Shower as above. Do not apply any lotions/deodorants the morning of surgery.   Please wear clean clothes to the hospital. Remember to brush your teeth with toothpaste.   Please read over the fact sheets that you were given.

## 2018-01-30 ENCOUNTER — Encounter (HOSPITAL_COMMUNITY): Payer: Self-pay

## 2018-01-30 ENCOUNTER — Other Ambulatory Visit: Payer: Self-pay

## 2018-01-30 ENCOUNTER — Encounter (HOSPITAL_COMMUNITY)
Admission: RE | Admit: 2018-01-30 | Discharge: 2018-01-30 | Disposition: A | Discharge status: Home / Self Care | Payer: 59 | Source: Ambulatory Visit | Attending: Neurosurgery | Admitting: Neurosurgery

## 2018-01-30 DIAGNOSIS — Z01812 Encounter for preprocedural laboratory examination: Secondary | ICD-10-CM | POA: Diagnosis not present

## 2018-01-30 HISTORY — DX: Pneumonia, unspecified organism: J18.9

## 2018-01-30 LAB — CBC
HCT: 44.2 % (ref 39.0–52.0)
HEMOGLOBIN: 14.7 g/dL (ref 13.0–17.0)
MCH: 29.4 pg (ref 26.0–34.0)
MCHC: 33.3 g/dL (ref 30.0–36.0)
MCV: 88.4 fL (ref 78.0–100.0)
Platelets: 287 10*3/uL (ref 150–400)
RBC: 5 MIL/uL (ref 4.22–5.81)
RDW: 12.4 % (ref 11.5–15.5)
WBC: 9.9 10*3/uL (ref 4.0–10.5)

## 2018-01-30 LAB — BASIC METABOLIC PANEL
ANION GAP: 11 (ref 5–15)
BUN: 10 mg/dL (ref 6–20)
CHLORIDE: 101 mmol/L (ref 98–111)
CO2: 27 mmol/L (ref 22–32)
Calcium: 9.6 mg/dL (ref 8.9–10.3)
Creatinine, Ser: 1.11 mg/dL (ref 0.61–1.24)
Glucose, Bld: 118 mg/dL — ABNORMAL HIGH (ref 70–99)
POTASSIUM: 4.9 mmol/L (ref 3.5–5.1)
Sodium: 139 mmol/L (ref 135–145)

## 2018-01-30 LAB — SURGICAL PCR SCREEN
MRSA, PCR: NEGATIVE
Staphylococcus aureus: POSITIVE — AB

## 2018-01-30 LAB — ABO/RH: ABO/RH(D): A POS

## 2018-01-30 LAB — HEMOGLOBIN A1C
Hgb A1c MFr Bld: 6.8 % — ABNORMAL HIGH (ref 4.8–5.6)
Mean Plasma Glucose: 148.46 mg/dL

## 2018-01-30 LAB — TYPE AND SCREEN
ABO/RH(D): A POS
Antibody Screen: NEGATIVE

## 2018-01-30 LAB — GLUCOSE, CAPILLARY: Glucose-Capillary: 106 mg/dL — ABNORMAL HIGH (ref 70–99)

## 2018-01-30 NOTE — Progress Notes (Signed)
Pt states he was seen by a cardiologist in February of this year due to pain between his shoulder blades and arm pain. States he had a stress test done and it was good. States the pain was coming from his herniated cervical disc. Pt is a type 2 diabetic. Last A1C was 6.4 on 10/05/17. He states his fasting blood sugar is usually between 105-125. Pt states he stopped his Aspirin and last dose was 01/28/18.

## 2018-01-30 NOTE — Progress Notes (Signed)
Mupirocin Ointment Rx called into [facility] of [address] in [place] for positive PCR of Staph. Pt notified of results and need to pick up Rx. He voiced understanding.

## 2018-02-05 ENCOUNTER — Ambulatory Visit (HOSPITAL_COMMUNITY): Payer: 59

## 2018-02-05 ENCOUNTER — Ambulatory Visit (HOSPITAL_COMMUNITY): Payer: 59 | Admitting: Certified Registered Nurse Anesthetist

## 2018-02-05 ENCOUNTER — Encounter (HOSPITAL_COMMUNITY): Payer: Self-pay | Admitting: *Deleted

## 2018-02-05 ENCOUNTER — Encounter (HOSPITAL_COMMUNITY)
Admission: RE | Disposition: A | Discharge status: Home / Self Care | Payer: Self-pay | Source: Ambulatory Visit | Attending: Neurosurgery

## 2018-02-05 ENCOUNTER — Observation Stay (HOSPITAL_COMMUNITY)
Admission: RE | Admit: 2018-02-05 | Discharge: 2018-02-06 | Disposition: A | Discharge status: Home / Self Care | Payer: 59 | Source: Ambulatory Visit | Attending: Neurosurgery | Admitting: Neurosurgery

## 2018-02-05 ENCOUNTER — Other Ambulatory Visit: Payer: Self-pay

## 2018-02-05 DIAGNOSIS — M4722 Other spondylosis with radiculopathy, cervical region: Secondary | ICD-10-CM | POA: Diagnosis not present

## 2018-02-05 DIAGNOSIS — K219 Gastro-esophageal reflux disease without esophagitis: Secondary | ICD-10-CM | POA: Insufficient documentation

## 2018-02-05 DIAGNOSIS — Z419 Encounter for procedure for purposes other than remedying health state, unspecified: Secondary | ICD-10-CM

## 2018-02-05 DIAGNOSIS — I10 Essential (primary) hypertension: Secondary | ICD-10-CM | POA: Insufficient documentation

## 2018-02-05 DIAGNOSIS — Z7982 Long term (current) use of aspirin: Secondary | ICD-10-CM | POA: Diagnosis not present

## 2018-02-05 DIAGNOSIS — M4802 Spinal stenosis, cervical region: Secondary | ICD-10-CM | POA: Insufficient documentation

## 2018-02-05 DIAGNOSIS — Z87891 Personal history of nicotine dependence: Secondary | ICD-10-CM | POA: Diagnosis not present

## 2018-02-05 DIAGNOSIS — E119 Type 2 diabetes mellitus without complications: Secondary | ICD-10-CM | POA: Insufficient documentation

## 2018-02-05 DIAGNOSIS — Z79899 Other long term (current) drug therapy: Secondary | ICD-10-CM | POA: Diagnosis not present

## 2018-02-05 DIAGNOSIS — M5020 Other cervical disc displacement, unspecified cervical region: Secondary | ICD-10-CM | POA: Diagnosis present

## 2018-02-05 DIAGNOSIS — Z7984 Long term (current) use of oral hypoglycemic drugs: Secondary | ICD-10-CM | POA: Diagnosis not present

## 2018-02-05 DIAGNOSIS — M5011 Cervical disc disorder with radiculopathy,  high cervical region: Principal | ICD-10-CM | POA: Insufficient documentation

## 2018-02-05 HISTORY — PX: ANTERIOR CERVICAL DECOMP/DISCECTOMY FUSION: SHX1161

## 2018-02-05 LAB — GLUCOSE, CAPILLARY
Glucose-Capillary: 123 mg/dL — ABNORMAL HIGH (ref 70–99)
Glucose-Capillary: 168 mg/dL — ABNORMAL HIGH (ref 70–99)
Glucose-Capillary: 174 mg/dL — ABNORMAL HIGH (ref 70–99)
Glucose-Capillary: 208 mg/dL — ABNORMAL HIGH (ref 70–99)

## 2018-02-05 SURGERY — ANTERIOR CERVICAL DECOMPRESSION/DISCECTOMY FUSION 1 LEVEL
Anesthesia: General | Site: Spine Cervical

## 2018-02-05 MED ORDER — ONDANSETRON HCL 4 MG/2ML IJ SOLN
INTRAMUSCULAR | Status: AC
  Filled 2018-02-05: qty 4

## 2018-02-05 MED ORDER — ACETAMINOPHEN 500 MG PO TABS
ORAL_TABLET | ORAL | Status: AC
  Filled 2018-02-05: qty 2

## 2018-02-05 MED ORDER — OXYCODONE HCL 5 MG PO TABS
ORAL_TABLET | ORAL | Status: AC
  Administered 2018-02-05: 5 mg via ORAL
  Filled 2018-02-05: qty 1

## 2018-02-05 MED ORDER — HYDROXYZINE HCL 25 MG PO TABS: 50.0000 mg | ORAL_TABLET | ORAL | Status: DC | PRN

## 2018-02-05 MED ORDER — BUPIVACAINE HCL (PF) 0.5 % IJ SOLN
INTRAMUSCULAR | Status: AC
  Filled 2018-02-05: qty 30

## 2018-02-05 MED ORDER — HYDROXYZINE HCL 50 MG/ML IM SOLN: 50.0000 mg | INTRAMUSCULAR | Status: DC | PRN

## 2018-02-05 MED ORDER — SODIUM CHLORIDE 0.9 % IV SOLN
INTRAVENOUS | Status: DC | PRN
  Administered 2018-02-05: 20 ug/min via INTRAVENOUS

## 2018-02-05 MED ORDER — FENTANYL CITRATE (PF) 100 MCG/2ML IJ SOLN
25.0000 ug | INTRAMUSCULAR | Status: DC | PRN
  Administered 2018-02-05 (×3): 50 ug via INTRAVENOUS

## 2018-02-05 MED ORDER — MAGNESIUM HYDROXIDE 400 MG/5ML PO SUSP: 30.0000 mL | Freq: Every day | ORAL | Status: DC | PRN

## 2018-02-05 MED ORDER — KETOROLAC TROMETHAMINE 30 MG/ML IJ SOLN
30.0000 mg | Freq: Four times a day (QID) | INTRAMUSCULAR | Status: DC
  Administered 2018-02-05 – 2018-02-06 (×3): 30 mg via INTRAVENOUS
  Filled 2018-02-05 (×3): qty 1

## 2018-02-05 MED ORDER — CEFAZOLIN SODIUM-DEXTROSE 2-4 GM/100ML-% IV SOLN
INTRAVENOUS | Status: AC
  Filled 2018-02-05: qty 100

## 2018-02-05 MED ORDER — FENTANYL CITRATE (PF) 250 MCG/5ML IJ SOLN
INTRAMUSCULAR | Status: DC | PRN
  Administered 2018-02-05 (×4): 50 ug via INTRAVENOUS
  Administered 2018-02-05: 150 ug via INTRAVENOUS
  Administered 2018-02-05: 50 ug via INTRAVENOUS

## 2018-02-05 MED ORDER — THROMBIN 5000 UNITS EX SOLR
CUTANEOUS | Status: AC
  Filled 2018-02-05: qty 10000

## 2018-02-05 MED ORDER — FENTANYL CITRATE (PF) 100 MCG/2ML IJ SOLN
INTRAMUSCULAR | Status: AC
  Administered 2018-02-05: 50 ug via INTRAVENOUS
  Filled 2018-02-05: qty 2

## 2018-02-05 MED ORDER — MORPHINE SULFATE (PF) 4 MG/ML IV SOLN
4.0000 mg | INTRAVENOUS | Status: DC | PRN
  Administered 2018-02-05 – 2018-02-06 (×2): 4 mg via INTRAMUSCULAR
  Filled 2018-02-05 (×2): qty 1

## 2018-02-05 MED ORDER — FENTANYL CITRATE (PF) 100 MCG/2ML IJ SOLN
INTRAMUSCULAR | Status: AC
  Filled 2018-02-05: qty 2

## 2018-02-05 MED ORDER — HYDROCODONE-ACETAMINOPHEN 5-325 MG PO TABS
1.0000 | ORAL_TABLET | ORAL | Status: DC | PRN
  Administered 2018-02-05 – 2018-02-06 (×5): 2 via ORAL
  Filled 2018-02-05 (×5): qty 2

## 2018-02-05 MED ORDER — ATORVASTATIN CALCIUM 10 MG PO TABS
10.0000 mg | ORAL_TABLET | Freq: Every day | ORAL | Status: DC
  Administered 2018-02-05: 10 mg via ORAL
  Filled 2018-02-05: qty 1

## 2018-02-05 MED ORDER — ONDANSETRON HCL 4 MG/2ML IJ SOLN
INTRAMUSCULAR | Status: DC | PRN
  Administered 2018-02-05: 4 mg via INTRAVENOUS

## 2018-02-05 MED ORDER — PHENYLEPHRINE 40 MCG/ML (10ML) SYRINGE FOR IV PUSH (FOR BLOOD PRESSURE SUPPORT)
PREFILLED_SYRINGE | INTRAVENOUS | Status: AC
  Filled 2018-02-05: qty 20

## 2018-02-05 MED ORDER — BUPIVACAINE HCL (PF) 0.5 % IJ SOLN
INTRAMUSCULAR | Status: DC | PRN
  Administered 2018-02-05: 3.5 mL

## 2018-02-05 MED ORDER — ACETAMINOPHEN 325 MG PO TABS: 650.0000 mg | ORAL_TABLET | ORAL | Status: DC | PRN

## 2018-02-05 MED ORDER — METFORMIN HCL ER 750 MG PO TB24
1500.0000 mg | ORAL_TABLET | Freq: Every day | ORAL | Status: DC
  Administered 2018-02-05: 1500 mg via ORAL
  Filled 2018-02-05: qty 2

## 2018-02-05 MED ORDER — SODIUM CHLORIDE 0.9 % IV SOLN
INTRAVENOUS | Status: DC | PRN
  Administered 2018-02-05: 500 mL

## 2018-02-05 MED ORDER — LIDOCAINE 2% (20 MG/ML) 5 ML SYRINGE
INTRAMUSCULAR | Status: AC
  Filled 2018-02-05: qty 10

## 2018-02-05 MED ORDER — DIAZEPAM 5 MG/ML IJ SOLN
2.5000 mg | Freq: Once | INTRAMUSCULAR | Status: AC
  Administered 2018-02-05: 2.5 mg via INTRAVENOUS

## 2018-02-05 MED ORDER — OXYCODONE HCL 5 MG/5ML PO SOLN: 5.0000 mg | Freq: Once | ORAL | Status: AC | PRN

## 2018-02-05 MED ORDER — LACTATED RINGERS IV SOLN
INTRAVENOUS | Status: DC
  Administered 2018-02-05 (×2): via INTRAVENOUS

## 2018-02-05 MED ORDER — HEMOSTATIC AGENTS (NO CHARGE) OPTIME
TOPICAL | Status: DC | PRN
  Administered 2018-02-05 (×2): 1 via TOPICAL

## 2018-02-05 MED ORDER — LIDOCAINE HCL (CARDIAC) PF 100 MG/5ML IV SOSY
PREFILLED_SYRINGE | INTRAVENOUS | Status: DC | PRN
  Administered 2018-02-05: 80 mg via INTRAVENOUS

## 2018-02-05 MED ORDER — HYDROXYZINE HCL 50 MG PO TABS: 50.0000 mg | ORAL_TABLET | ORAL | Status: DC | PRN

## 2018-02-05 MED ORDER — OXYCODONE HCL 5 MG PO TABS
5.0000 mg | ORAL_TABLET | Freq: Once | ORAL | Status: AC | PRN
  Administered 2018-02-05: 5 mg via ORAL

## 2018-02-05 MED ORDER — SUGAMMADEX SODIUM 200 MG/2ML IV SOLN
INTRAVENOUS | Status: DC | PRN
  Administered 2018-02-05: 209.4 mg via INTRAVENOUS

## 2018-02-05 MED ORDER — FLEET ENEMA 7-19 GM/118ML RE ENEM: 1.0000 | ENEMA | Freq: Once | RECTAL | Status: DC | PRN

## 2018-02-05 MED ORDER — ALUM & MAG HYDROXIDE-SIMETH 200-200-20 MG/5ML PO SUSP: 30.0000 mL | Freq: Four times a day (QID) | ORAL | Status: DC | PRN

## 2018-02-05 MED ORDER — MIDAZOLAM HCL 5 MG/5ML IJ SOLN
INTRAMUSCULAR | Status: DC | PRN
  Administered 2018-02-05: 2 mg via INTRAVENOUS

## 2018-02-05 MED ORDER — CEFAZOLIN SODIUM-DEXTROSE 2-4 GM/100ML-% IV SOLN
2.0000 g | INTRAVENOUS | Status: AC
  Administered 2018-02-05: 2 g via INTRAVENOUS

## 2018-02-05 MED ORDER — DIAZEPAM 5 MG/ML IJ SOLN
INTRAMUSCULAR | Status: AC
  Administered 2018-02-05: 2.5 mg via INTRAVENOUS
  Filled 2018-02-05: qty 2

## 2018-02-05 MED ORDER — FENTANYL CITRATE (PF) 250 MCG/5ML IJ SOLN
INTRAMUSCULAR | Status: AC
  Filled 2018-02-05: qty 5

## 2018-02-05 MED ORDER — THROMBIN 5000 UNITS EX SOLR
CUTANEOUS | Status: DC | PRN
  Administered 2018-02-05 (×2): 5000 [IU] via TOPICAL

## 2018-02-05 MED ORDER — 0.9 % SODIUM CHLORIDE (POUR BTL) OPTIME
TOPICAL | Status: DC | PRN
  Administered 2018-02-05: 1000 mL

## 2018-02-05 MED ORDER — KETOROLAC TROMETHAMINE 30 MG/ML IJ SOLN
INTRAMUSCULAR | Status: AC
  Administered 2018-02-05: 30 mg via INTRAVENOUS
  Filled 2018-02-05: qty 1

## 2018-02-05 MED ORDER — LIDOCAINE-EPINEPHRINE 1 %-1:100000 IJ SOLN
INTRAMUSCULAR | Status: AC
  Filled 2018-02-05: qty 1

## 2018-02-05 MED ORDER — ACETAMINOPHEN 500 MG PO TABS
1000.0000 mg | ORAL_TABLET | Freq: Once | ORAL | Status: AC
  Administered 2018-02-05: 1000 mg via ORAL
  Filled 2018-02-05: qty 2

## 2018-02-05 MED ORDER — LISINOPRIL 20 MG PO TABS
20.0000 mg | ORAL_TABLET | Freq: Every day | ORAL | Status: DC
  Administered 2018-02-05 – 2018-02-06 (×2): 20 mg via ORAL
  Filled 2018-02-05 (×2): qty 1

## 2018-02-05 MED ORDER — CHLORHEXIDINE GLUCONATE CLOTH 2 % EX PADS: 6.0000 | MEDICATED_PAD | Freq: Once | CUTANEOUS | Status: DC

## 2018-02-05 MED ORDER — ACETAMINOPHEN 500 MG PO TABS
1000.0000 mg | ORAL_TABLET | Freq: Four times a day (QID) | ORAL | Status: AC | PRN
  Administered 2018-02-05: 1000 mg via ORAL

## 2018-02-05 MED ORDER — PHENYLEPHRINE HCL 10 MG/ML IJ SOLN
INTRAMUSCULAR | Status: DC | PRN
  Administered 2018-02-05 (×5): 80 ug via INTRAVENOUS

## 2018-02-05 MED ORDER — LIDOCAINE-EPINEPHRINE 1 %-1:100000 IJ SOLN
INTRAMUSCULAR | Status: DC | PRN
  Administered 2018-02-05: 3.5 mL

## 2018-02-05 MED ORDER — SODIUM CHLORIDE 0.9 % IV SOLN: 250.0000 mL | INTRAVENOUS | Status: DC

## 2018-02-05 MED ORDER — SODIUM CHLORIDE 0.9% FLUSH: 3.0000 mL | INTRAVENOUS | Status: DC | PRN

## 2018-02-05 MED ORDER — DEXAMETHASONE SODIUM PHOSPHATE 10 MG/ML IJ SOLN
INTRAMUSCULAR | Status: AC
  Filled 2018-02-05: qty 2

## 2018-02-05 MED ORDER — PANTOPRAZOLE SODIUM 40 MG PO TBEC
40.0000 mg | DELAYED_RELEASE_TABLET | Freq: Every day | ORAL | Status: DC
  Administered 2018-02-05 – 2018-02-06 (×2): 40 mg via ORAL
  Filled 2018-02-05 (×2): qty 1

## 2018-02-05 MED ORDER — ACETAMINOPHEN 650 MG RE SUPP: 650.0000 mg | RECTAL | Status: DC | PRN

## 2018-02-05 MED ORDER — MIDAZOLAM HCL 2 MG/2ML IJ SOLN
INTRAMUSCULAR | Status: AC
  Filled 2018-02-05: qty 2

## 2018-02-05 MED ORDER — ACETAMINOPHEN 500 MG PO TABS
ORAL_TABLET | ORAL | Status: AC
  Administered 2018-02-05: 1000 mg via ORAL
  Filled 2018-02-05: qty 2

## 2018-02-05 MED ORDER — EPHEDRINE SULFATE 50 MG/ML IJ SOLN
INTRAMUSCULAR | Status: AC
  Filled 2018-02-05: qty 1

## 2018-02-05 MED ORDER — CYCLOBENZAPRINE HCL 5 MG PO TABS
5.0000 mg | ORAL_TABLET | Freq: Three times a day (TID) | ORAL | Status: DC | PRN
  Administered 2018-02-05 – 2018-02-06 (×2): 10 mg via ORAL
  Filled 2018-02-05 (×2): qty 2

## 2018-02-05 MED ORDER — PROPOFOL 10 MG/ML IV BOLUS
INTRAVENOUS | Status: AC
  Filled 2018-02-05: qty 20

## 2018-02-05 MED ORDER — KETOROLAC TROMETHAMINE 30 MG/ML IJ SOLN
30.0000 mg | Freq: Once | INTRAMUSCULAR | Status: AC
  Administered 2018-02-05: 30 mg via INTRAVENOUS

## 2018-02-05 MED ORDER — PHENOL 1.4 % MT LIQD: 1.0000 | OROMUCOSAL | Status: DC | PRN

## 2018-02-05 MED ORDER — ROCURONIUM BROMIDE 10 MG/ML (PF) SYRINGE
PREFILLED_SYRINGE | INTRAVENOUS | Status: AC
  Filled 2018-02-05: qty 20

## 2018-02-05 MED ORDER — SUGAMMADEX SODIUM 200 MG/2ML IV SOLN
INTRAVENOUS | Status: AC
  Filled 2018-02-05: qty 4

## 2018-02-05 MED ORDER — ROCURONIUM BROMIDE 100 MG/10ML IV SOLN
INTRAVENOUS | Status: DC | PRN
  Administered 2018-02-05: 5 mg via INTRAVENOUS
  Administered 2018-02-05: 60 mg via INTRAVENOUS
  Administered 2018-02-05: 5 mg via INTRAVENOUS

## 2018-02-05 MED ORDER — BISACODYL 10 MG RE SUPP: 10.0000 mg | Freq: Every day | RECTAL | Status: DC | PRN

## 2018-02-05 MED ORDER — SODIUM CHLORIDE 0.9% FLUSH: 3.0000 mL | Freq: Two times a day (BID) | INTRAVENOUS | Status: DC

## 2018-02-05 MED ORDER — DEXAMETHASONE SODIUM PHOSPHATE 4 MG/ML IJ SOLN
INTRAMUSCULAR | Status: DC | PRN
  Administered 2018-02-05: 5 mg via INTRAVENOUS

## 2018-02-05 MED ORDER — PROPOFOL 10 MG/ML IV BOLUS
INTRAVENOUS | Status: DC | PRN
  Administered 2018-02-05: 200 mg via INTRAVENOUS

## 2018-02-05 MED ORDER — SODIUM CHLORIDE 0.45 % IV SOLN
INTRAVENOUS | Status: DC
Start: 2018-02-05 — End: 2018-02-06

## 2018-02-05 MED ORDER — MENTHOL 3 MG MT LOZG: 1.0000 | LOZENGE | OROMUCOSAL | Status: DC | PRN

## 2018-02-05 SURGICAL SUPPLY — 65 items
BAG DECANTER FOR FLEXI CONT (MISCELLANEOUS) ×2 IMPLANT
BIT DRILL NEURO 2X3.1 SFT TUCH (MISCELLANEOUS) ×1 IMPLANT
BIT DRILL SPINAL 9 (BIT) ×2 IMPLANT
BIT DRILL WIRE PASS 1.3MM (BIT) ×1 IMPLANT
BLADE ULTRA TIP 2M (BLADE) IMPLANT
CANISTER SUCT 3000ML PPV (MISCELLANEOUS) ×2 IMPLANT
CARTRIDGE OIL MAESTRO DRILL (MISCELLANEOUS) ×1 IMPLANT
COVER MAYO STAND STRL (DRAPES) ×2 IMPLANT
DECANTER SPIKE VIAL GLASS SM (MISCELLANEOUS) ×2 IMPLANT
DERMABOND ADVANCED (GAUZE/BANDAGES/DRESSINGS) ×1
DERMABOND ADVANCED .7 DNX12 (GAUZE/BANDAGES/DRESSINGS) ×1 IMPLANT
DIFFUSER DRILL AIR PNEUMATIC (MISCELLANEOUS) ×2 IMPLANT
DRAPE C-ARM 42X72 X-RAY (DRAPES) ×2 IMPLANT
DRAPE C-ARMOR (DRAPES) ×2 IMPLANT
DRAPE HALF SHEET 40X57 (DRAPES) ×2 IMPLANT
DRAPE LAPAROTOMY 100X72 PEDS (DRAPES) ×2 IMPLANT
DRAPE MICROSCOPE LEICA (MISCELLANEOUS) ×2 IMPLANT
DRAPE POUCH INSTRU U-SHP 10X18 (DRAPES) IMPLANT
DRILL NEURO 2X3.1 SOFT TOUCH (MISCELLANEOUS) ×2
DRILL WIRE PASS 1.3MM (BIT) ×2
ELECT COATED BLADE 2.86 ST (ELECTRODE) ×2 IMPLANT
ELECT REM PT RETURN 9FT ADLT (ELECTROSURGICAL) ×2
ELECTRODE REM PT RTRN 9FT ADLT (ELECTROSURGICAL) ×1 IMPLANT
FLOSEAL 5ML (HEMOSTASIS) ×2 IMPLANT
GAUZE SPONGE 4X4 12PLY STRL LF (GAUZE/BANDAGES/DRESSINGS) ×2 IMPLANT
GLOVE BIOGEL PI IND STRL 6.5 (GLOVE) ×2 IMPLANT
GLOVE BIOGEL PI IND STRL 7.5 (GLOVE) ×4 IMPLANT
GLOVE BIOGEL PI IND STRL 8 (GLOVE) ×2 IMPLANT
GLOVE BIOGEL PI INDICATOR 6.5 (GLOVE) ×2
GLOVE BIOGEL PI INDICATOR 7.5 (GLOVE) ×4
GLOVE BIOGEL PI INDICATOR 8 (GLOVE) ×2
GLOVE ECLIPSE 7.5 STRL STRAW (GLOVE) ×2 IMPLANT
GLOVE EXAM NITRILE LRG STRL (GLOVE) IMPLANT
GLOVE EXAM NITRILE XL STR (GLOVE) IMPLANT
GLOVE EXAM NITRILE XS STR PU (GLOVE) IMPLANT
GLOVE SURG SS PI 6.0 STRL IVOR (GLOVE) ×2 IMPLANT
GOWN STRL REUS W/ TWL LRG LVL3 (GOWN DISPOSABLE) ×1 IMPLANT
GOWN STRL REUS W/ TWL XL LVL3 (GOWN DISPOSABLE) ×2 IMPLANT
GOWN STRL REUS W/TWL 2XL LVL3 (GOWN DISPOSABLE) ×2 IMPLANT
GOWN STRL REUS W/TWL LRG LVL3 (GOWN DISPOSABLE) ×1
GOWN STRL REUS W/TWL XL LVL3 (GOWN DISPOSABLE) ×2
HALTER HD/CHIN CERV TRACTION D (MISCELLANEOUS) ×2 IMPLANT
HEMOSTAT POWDER KIT SURGIFOAM (HEMOSTASIS) IMPLANT
KIT BASIN OR (CUSTOM PROCEDURE TRAY) ×2 IMPLANT
KIT TURNOVER KIT B (KITS) ×2 IMPLANT
NEEDLE HYPO 25X1 1.5 SAFETY (NEEDLE) ×2 IMPLANT
NEEDLE SPNL 22GX3.5 QUINCKE BK (NEEDLE) ×4 IMPLANT
NS IRRIG 1000ML POUR BTL (IV SOLUTION) ×2 IMPLANT
OIL CARTRIDGE MAESTRO DRILL (MISCELLANEOUS) ×2
PACK LAMINECTOMY NEURO (CUSTOM PROCEDURE TRAY) ×2 IMPLANT
PAD ARMBOARD 7.5X6 YLW CONV (MISCELLANEOUS) ×6 IMPLANT
PLATE MINI DIVERG 20.5 (Plate) ×2 IMPLANT
PLATE SPINAL CERVICAL 18.5 (Plate) IMPLANT
RUBBERBAND STERILE (MISCELLANEOUS) ×4 IMPLANT
SCREW CANN PED 3.5X15 (Screw) ×8 IMPLANT
SPONGE INTESTINAL PEANUT (DISPOSABLE) ×2 IMPLANT
SPONGE SURGIFOAM ABS GEL SZ50 (HEMOSTASIS) ×2 IMPLANT
STAPLER SKIN PROX WIDE 3.9 (STAPLE) ×2 IMPLANT
SUT VIC AB 2-0 CP2 18 (SUTURE) ×4 IMPLANT
SUT VIC AB 3-0 SH 8-18 (SUTURE) ×2 IMPLANT
TAPE CLOTH SURG 4X10 WHT LF (GAUZE/BANDAGES/DRESSINGS) ×2 IMPLANT
TISSUE DIVERG L-ACC 9X15X9.4 (Putty) ×2 IMPLANT
TOWEL GREEN STERILE (TOWEL DISPOSABLE) ×2 IMPLANT
TOWEL GREEN STERILE FF (TOWEL DISPOSABLE) IMPLANT
WATER STERILE IRR 1000ML POUR (IV SOLUTION) ×2 IMPLANT

## 2018-02-05 NOTE — Progress Notes (Signed)
Dr. Maple HudsonMoser called about patient pain. No further orders recieved

## 2018-02-05 NOTE — Progress Notes (Signed)
No SCD's available  During PACU stay- pt requests to go up to [facility] via stretcher. Attempt to call report at [phone]

## 2018-02-05 NOTE — Anesthesia Preprocedure Evaluation (Addendum)
Anesthesia Evaluation  Patient identified by MRN, date of birth, ID band Patient awake    Reviewed: Allergy & Precautions, NPO status , Patient's Chart, lab work & pertinent test results  History of Anesthesia Complications Negative for: history of anesthetic complications  Airway Mallampati: II  TM Distance: >3 FB Neck ROM: Full    Dental  (+) Teeth Intact, Dental Advisory Given   Pulmonary neg shortness of breath, neg sleep apnea, neg COPD, neg recent URI, former smoker,    breath sounds clear to auscultation       Cardiovascular hypertension, Pt. on medications (-) angina(-) Past MI and (-) CHF  Rhythm:Regular     Neuro/Psych negative neurological ROS  negative psych ROS   GI/Hepatic Neg liver ROS, GERD  Medicated and Controlled,  Endo/Other  diabetes, Type 2, Oral Hypoglycemic Agents  Renal/GU negative Renal ROS     Musculoskeletal   Abdominal   Peds  Hematology negative hematology ROS (+)   Anesthesia Other Findings   Reproductive/Obstetrics                           Anesthesia Physical Anesthesia Plan  ASA: II  Anesthesia Plan: General   Post-op Pain Management:    Induction: Intravenous  PONV Risk Score and Plan: 2 and Ondansetron and Dexamethasone  Airway Management Planned: Oral ETT  Additional Equipment: None  Intra-op Plan:   Post-operative Plan: Extubation in OR  Informed Consent: I have reviewed the patients History and Physical, chart, labs and discussed the procedure including the risks, benefits and alternatives for the proposed anesthesia with the patient or authorized representative who has indicated his/her understanding and acceptance.   Dental advisory given  Plan Discussed with: CRNA and Surgeon  Anesthesia Plan Comments:        Anesthesia Quick Evaluation

## 2018-02-05 NOTE — Transfer of Care (Signed)
Immediate Anesthesia Transfer of Care Note  Patient: Lee BushyJeffrey Jay Kramer  Procedure(s) Performed: ANTERIOR CERVICAL DECOMPRESSION/DISCECTOMY FUSION CERVICAL THREE- CERVICAL FOUR (N/A Spine Cervical)  Patient Location: PACU  Anesthesia Type:General  Level of Consciousness: awake, patient cooperative and responds to stimulation  Airway & Oxygen Therapy: Patient Spontanous Breathing and Patient connected to face mask oxygen  Post-op Assessment: Report given to RN, Post -op Vital signs reviewed and stable and Patient moving all extremities X 4  Post vital signs: Reviewed and stable  Last Vitals:  Vitals Value Taken Time  BP 153/91 02/05/2018  1:52 PM  Temp    Pulse 105 02/05/2018  1:53 PM  Resp 11 02/05/2018  1:53 PM  SpO2 96 % 02/05/2018  1:53 PM  Vitals shown include unvalidated device data.  Last Pain:  Vitals:   02/05/18 1034  TempSrc:   PainSc: 10-Worst pain ever      Patients Stated Pain Goal: 3 (02/05/18 0929)  Complications: No apparent anesthesia complications

## 2018-02-05 NOTE — Progress Notes (Signed)
Vitals:   02/05/18 1515 02/05/18 1530 02/05/18 1550 02/05/18 1609  BP:    132/78  Pulse: 100  93 89  Resp: 12  12 (!) 21  Temp: 98.4 F (36.9 C)  98.4 F (36.9 C) 98.3 F (36.8 C)  TempSrc:    Oral  SpO2: 94% 95% 95% 98%  Weight:      Height:       Patient resting in bed, good relief of right cervical radicular pain, but moderate posterior neck pain.  Incision clean and dry; no erythema, ecchymosis, swelling, or drainage.  Patient has voided.  Patient has not yet ambulated.  Plan: Patient with moderate postoperative pain.  Encouraged to ambulate at least 3 times this evening in the halls, and more tomorrow.  Continue to progress through postoperative recovery.  Hewitt ShortsNUDELMAN,ROBERT W, MD 02/05/2018, 6:57 PM

## 2018-02-05 NOTE — Anesthesia Procedure Notes (Signed)
Procedure Name: Intubation Date/Time: 02/05/2018 11:00 AM Performed by: Glynda Jaeger, CRNA Pre-anesthesia Checklist: Patient identified, Patient being monitored, Timeout performed, Emergency Drugs available and Suction available Patient Re-evaluated:Patient Re-evaluated prior to induction Oxygen Delivery Method: Circle System Utilized Preoxygenation: Pre-oxygenation with 100% oxygen Induction Type: IV induction Ventilation: Mask ventilation without difficulty Laryngoscope Size: Mac and 4 Grade View: Grade I Tube type: Oral Tube size: 7.5 mm Number of attempts: 1 Airway Equipment and Method: Stylet Placement Confirmation: ETT inserted through vocal cords under direct vision,  positive ETCO2 and breath sounds checked- equal and bilateral Secured at: 21 cm Tube secured with: Tape Dental Injury: Teeth and Oropharynx as per pre-operative assessment

## 2018-02-05 NOTE — H&P (Signed)
Subjective: Patient is a 52 y.o. left handed white male who is admitted for treatment of C3-4 cervical spondylosis, degenerative disease, and spondylitic disc herniation.  History is notable for having undergone a previous 3 level C4-5 C5-6 and C6-7 ACDF [place] [facility] [place] [facility].  He had previous cervical spine surgery in 1996.  Dramatically complains of posterior neck pain, extending into the right intrascapular region at times up into the occiput.  He also complains of numbness and tilling in the medial aspect of the right arm and forearm and into the right fifth digit.  He was studied with a MRI scan by a physician in [place] earlier this year, and we studied him further with a myelogram post milligrams CT scan last month.  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  It confirmed degeneration at C3-4 with spondylitic disc herniation and canal stenosis and right C3-4 spondylitic neuroforaminal stenosis.  Patient is admitted now for a C3-4 anterior cervical decompression and arthrodesis with structural allograft and cervical plating.   Past Medical History:  Diagnosis Date  . Diabetes mellitus without complication (HCC)   . GERD (gastroesophageal reflux disease)   . Hypertension   . Pneumonia 2013    Past Surgical History:  Procedure Laterality Date  . ANTERIOR FUSION CERVICAL SPINE    . BACK SURGERY    . COLONOSCOPY    . ELBOW SURGERY    . ROTATOR CUFF REPAIR    . SHOULDER ARTHROSCOPY WITH DISTAL CLAVICLE RESECTION Left 09/10/2015   Procedure: SHOULDER ARTHROSCOPY WITH DISTAL CLAVICLE RESECTION DEBRIDE LABRUM DEBRIDEMENT CALCIFIC ROTATOR CUFF;  Surgeon: Loreta Aveaniel F Murphy, MD;  Location: [facility];  Service: Orthopedics;  Laterality: Left;  . TONSILLECTOMY      No medications prior to admission.   No Known Allergies  Social History   Tobacco Use  . Smoking status: Former Smoker   Last attempt to quit: 01/31/2011    Years since quitting: 7.0  . Smokeless tobacco: Never Used  Substance Use Topics  . Alcohol use: Yes    Comment: occas    Family History  Adopted: Yes     Review of Systems Pertinent items noted in HPI and remainder of comprehensive ROS otherwise negative.  Objective:  EXAM: Patient well-developed well-nourished white male in no acute distress.   Lungs are clear to auscultation , the patient has symmetrical respiratory excursion. Heart has a regular rate and rhythm normal S1 and S2 no murmur.   Abdomen is soft nontender nondistended bowel sounds are present. Extremity examination shows no clubbing cyanosis or edema. Motor examination shows 5 over 5 strength in the upper extremities including the deltoid biceps triceps and intrinsics and grip, except for the right intrinsics which are 4/5. Sensation is decreased to pinprick in the fifth digit of the right hand.  Reflex examination shows the left biceps is minimal to absent, the right biceps is trace.  Brachialis and triceps are minimal to absent bilaterally.  Quadriceps are trace to 1 bilaterally and gastric limits are absent belly.  Toes are downgoing belly.  He has a normal gait and stance.   Data Review:CBC    Component Value Date/Time   WBC 9.9 01/30/2018 0849   RBC 5.00 01/30/2018 0849   HGB 14.7 01/30/2018 0849   HCT 44.2 01/30/2018 0849   PLT 287 01/30/2018 0849   MCV 88.4 01/30/2018 0849   MCH 29.4 01/30/2018 0849  MCHC 33.3 01/30/2018 0849   RDW 12.4 01/30/2018 0849                          BMET    Component Value Date/Time   NA 139 01/30/2018 0849   K 4.9 01/30/2018 0849   CL 101 01/30/2018 0849   CO2 27 01/30/2018 0849   GLUCOSE 118 (H) 01/30/2018 0849   BUN 10 01/30/2018 0849   CREATININE 1.11 01/30/2018 0849   CALCIUM 9.6 01/30/2018 0849   GFRNONAA NOT CALCULATED 01/30/2018 0849   GFRAA NOT CALCULATED 01/30/2018 0849     Assessment/Plan: Patient with neck pain and  interscapular pain and associated right cervical radiculopathy and cervicogenic headache.  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  He is admitted now for C3-4 anterior cervical decompression and arthrodesis.  I've discussed with the patient the nature of his condition, the nature the surgical procedure, the typical length of surgery, hospital stay, and overall recuperation. We discussed limitations postoperatively. I discussed risks of surgery including risks of infection, bleeding, possibly need for transfusion, the risk of nerve root dysfunction with pain, weakness, numbness, or paresthesias, the risk of spinal cord dysfunction with paralysis of all 4 limbs and quadriplegia, and the risk of dural tear and CSF leakage and possible need for further surgery, the risk of esophageal dysfunction causing dysphagia and the risk of laryngeal dysfunction causing hoarseness of the voice, the risk of failure of the arthrodesis and the possible need for further surgery, and the risk of anesthetic complications including myocardial infarction, stroke, pneumonia, and death. We also discussed the need for postoperative immobilization in a cervical collar. Understanding all this the patient does wish to proceed with surgery and is admitted for such.    Hewitt Shorts, MD 02/05/2018 7:38 AM

## 2018-02-05 NOTE — Op Note (Signed)
02/05/2018  1:42 PM  PATIENT:  Lee Kramer  52 y.o. male  PRE-OPERATIVE DIAGNOSIS: C3-4 cervical disc herniation, cervical spondylosis, cervical degenerative disease, cervical stenosis, cervicalgia, cervical radiculopathy  POST-OPERATIVE DIAGNOSIS:  C3-4 cervical disc herniation, cervical spondylosis, cervical degenerative disease, cervical stenosis, cervicalgia, cervical radiculopathy  PROCEDURE:  Procedure(s): C3-4 anterior cervical decompression and arthrodesis with structural allograft and [employer] Divergence cervical plating  SURGEON: Shirlean Kellyobert Bernerd Terhune, MD   ASSISTANTS: Donalee CitrinGary Cram, MD  ANESTHESIA:   general  EBL:  Total I/O In: 1000 [I.V.:1000] Out: 25 [Blood:25]  BLOOD ADMINISTERED:none  COUNT:  Correct per nursing staff  DICTATION: Patient was brought to the operating room placed under general endotracheal anesthesia. Patient was placed in 10 pounds of halter traction. The neck was prepped with Betadine soap and solution and draped in a sterile fashion. A horizontal incision was made on the left side of the neck. The line of the incision was infiltrated with local anesthetic with epinephrine. Dissection was carried down thru the subcutaneous tissue and platysma, bipolar cautery was used to maintain hemostasis. Dissection was then carried down thru an avascular plane leaving the sternocleidomastoid carotid artery and jugular vein laterally and the trachea and esophagus medially. The ventral aspect of the vertebral column was identified, we identified the existing anterior cervical plate at the level of the C4 vertebral body, and thereby identified the C3-4 disc space level. The annulus was incised and the disc space entered. Discectomy was performed with micro-curettes and pituitary rongeurs. The operating microscope was draped and brought into the field provided additional magnification illumination and visualization. Discectomy was continued posteriorly thru the disc space and  then the cartilaginous endplate was removed using micro-curettes along with the high-speed drill. Posterior osteophytic overgrowth was removed using the high-speed drill along with a 2 mm thin footplated Kerrison punch. Posterior longitudinal ligament along with disc herniation was carefully removed, decompressing the spinal canal and thecal sac. We then continued to remove osteophytic overgrowth and disc material decompressing the neural foramina and exiting nerve roots bilaterally. Once the decompression was completed hemostasis was established with the use of Gelfoam with thrombin. The Gelfoam was removed the wound irrigated, a thin layer Surgifoam applied, and hemostasis confirmed. We then measured the height of the intravertebral disc space and selected a 9 millimeter in height structural allograft. It was hydrated and saline solution and then gently positioned in the intravertebral disc space and countersunk. We then selected a 20.5 millimeter in height Divergence r cervical plate. It was positioned over the fusion construct and secured to the vertebra with 3.5 x 15 mm self drilling screws at each level. Each screw hole was started with the high-speed drill and then the screws placed once all the screws were placed, the locking system was secured. The wound was irrigated with bacitracin solution checked for hemostasis which was established and confirmed. An x-ray was taken which showed the graft in good position, and the plate and screws in good position.  The wound was irrigated with bacitracin solution, hemostasis was confirmed.  We then proceeded with closure. The platysma was closed with interrupted inverted 2-0 undyed Vicryl suture, the subcutaneous and subcuticular closed with interrupted inverted 3-0 undyed Vicryl suture. The skin edges were approximated with Dermabond. Following surgery the patient was taken out of cervical traction. To be reversed and the anesthetic and taken to the recovery room for  further care.   PLAN OF CARE: Admit for overnight observation  PATIENT DISPOSITION:  PACU - hemodynamically stable.  Delay start of Pharmacological VTE agent (>24hrs) due to surgical blood loss or risk of bleeding:  yes

## 2018-02-06 DIAGNOSIS — M5011 Cervical disc disorder with radiculopathy,  high cervical region: Secondary | ICD-10-CM | POA: Diagnosis not present

## 2018-02-06 LAB — GLUCOSE, CAPILLARY: Glucose-Capillary: 174 mg/dL — ABNORMAL HIGH (ref 70–99)

## 2018-02-06 MED ORDER — HYDROXYZINE HCL 50 MG PO TABS: 50.0000 mg | ORAL_TABLET | ORAL | 0 refills | Status: AC | PRN

## 2018-02-06 MED ORDER — CYCLOBENZAPRINE HCL 10 MG PO TABS
10.0000 mg | ORAL_TABLET | Freq: Three times a day (TID) | ORAL | Status: DC | PRN
  Administered 2018-02-06: 10 mg via ORAL
  Filled 2018-02-06: qty 1

## 2018-02-06 MED ORDER — HYDROCODONE-ACETAMINOPHEN 5-325 MG PO TABS: 1.0000 | ORAL_TABLET | ORAL | 0 refills | Status: DC | PRN

## 2018-02-06 MED ORDER — CYCLOBENZAPRINE HCL 10 MG PO TABS: 10.0000 mg | ORAL_TABLET | Freq: Three times a day (TID) | ORAL | 1 refills | Status: AC | PRN

## 2018-02-06 NOTE — Discharge Summary (Signed)
Physician Discharge Summary  Patient ID: Lee Kramer MRN: [id number] DOB/AGE: [birth year] 52 y.o.  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  Postoperatively he has been up and ambulating actively.  He is voiding well.  His incision is healing nicely.  He has had good relief of his right cervical radicular pain, but still is having discomfort in the right side of the neck.  He does have some nausea with medications, and we have given him prescriptions for an analgesic, muscle relaxant, and an antiemetic.  He and his wife have been given instructions regarding wound care and activities.  He is scheduled for follow-up with me 3 to 4 weeks.  Discharge Exam: Blood pressure 123/72, pulse 74, temperature 98.5 F (36.9 C), temperature source Oral, resp. rate (!) 28, height 6' 0.5" (1.842 m), weight 104.7 kg (230 lb 13 oz), SpO2 98 %.  Disposition: Discharge disposition: 01-Home or Self Care       Discharge Instructions    Discharge wound care:   Complete by:  As directed    Leave the wound open to air. Shower daily with the wound uncovered. Water and soapy water should run over the incision area. Do not wash directly on the incision for 2 weeks. Remove the glue after 2 weeks.   Driving Restrictions   Complete by:  As directed    No driving for 2 weeks. May ride in the car locally now. May begin to drive  locally in 2 weeks.   Other Restrictions   Complete by:  As directed    Walk gradually increasing distances out in the fresh air at least twice a day. Walking additional 6 times inside the house, gradually increasing distances, daily. No bending, lifting, or twisting. Perform activities between shoulder and waist height (that is at counter height when standing or table height when sitting).     Allergies as of 02/06/2018   No Known Allergies     Medication List    TAKE these medications   acetaminophen 500 MG tablet Commonly known as:  TYLENOL Take 1,000 mg by mouth 3 (three) times daily as needed for moderate pain.   aspirin 81 MG tablet Take 81 mg by mouth daily.   atorvastatin 10 MG tablet Commonly known as:  LIPITOR Take 10 mg by mouth daily.   cetirizine-pseudoephedrine 5-120 MG tablet Commonly known as:  ZYRTEC-D Take 1 tablet by mouth daily as needed for allergies.   CIALIS 10 MG tablet Generic drug:  tadalafil Take 10 mg by mouth daily as needed for erectile dysfunction.   cyclobenzaprine 10 MG tablet Commonly known as:  FLEXERIL Take 1 tablet (10 mg total) by mouth 3 (three) times daily as needed for muscle spasms.   HYDROcodone-acetaminophen 5-325 MG tablet Commonly known as:  NORCO/VICODIN Take 1-2 tablets by mouth every 4 (four) hours as needed (pain).   hydrOXYzine 50 MG tablet Commonly known as:  ATARAX/VISTARIL Take 1 tablet (50 mg total) by mouth every 4 (four) hours as needed for nausea.   lisinopril 20 MG tablet Commonly known as:  PRINIVIL,ZESTRIL Take  20 mg by mouth daily.   metFORMIN 500 MG 24 hr tablet Commonly known as:  GLUCOPHAGE-XR Take 1,500 mg by mouth every evening.   multivitamin with minerals Tabs tablet Take 1 tablet by mouth daily.   omeprazole 20 MG capsule Commonly known as:  PRILOSEC Take 20 mg by mouth daily.   oxymetazoline 0.05 % nasal spray Commonly known as:  AFRIN Place 1 spray into both nostrils daily as needed  for congestion.            Discharge Care Instructions  (From admission, onward)        Start     Ordered   02/06/18 0000  Discharge wound care:    Comments:  Leave the wound open to air. Shower daily with the wound uncovered. Water and soapy water should run over the incision area. Do not wash directly on the incision for 2 weeks. Remove the glue after 2 weeks.   02/06/18 0836       Signed: Hewitt Shorts 02/06/2018, 8:36 AM

## 2018-02-06 NOTE — Progress Notes (Signed)
Patient alert and oriented, Mae's well, voiding adequate amount of urine, swallowing without difficulty, c/o moderate pain and meds given prior to discharged for ride and discomfort. Patient discharged home with family. Script and discharged instructions given to patient. Patient and family stated understanding of instructions given. Patient has an appointment with Dr. Newell CoralNudelman in 3 weeks.

## 2018-02-07 ENCOUNTER — Encounter (HOSPITAL_COMMUNITY): Payer: Self-pay | Admitting: Neurosurgery

## 2018-02-08 ENCOUNTER — Encounter (HOSPITAL_COMMUNITY): Payer: Self-pay | Admitting: Neurosurgery

## 2018-02-08 NOTE — Anesthesia Postprocedure Evaluation (Signed)
Anesthesia Post Note  Patient: Lee BushyJeffrey Jay Kramer  Procedure(s) Performed: ANTERIOR CERVICAL DECOMPRESSION/DISCECTOMY FUSION CERVICAL THREE- CERVICAL FOUR (N/A Spine Cervical)     Patient location during evaluation: PACU Anesthesia Type: General Level of consciousness: awake and alert Pain management: pain level controlled Vital Signs Assessment: post-procedure vital signs reviewed and stable Respiratory status: spontaneous breathing, nonlabored ventilation, respiratory function stable and patient connected to nasal cannula oxygen Cardiovascular status: blood pressure returned to baseline and stable Postop Assessment: no apparent nausea or vomiting Anesthetic complications: no    Last Vitals:  Vitals:   02/06/18 0330 02/06/18 0720  BP: 111/70 123/72  Pulse: 85 74  Resp: 20 (!) 28  Temp: 36.8 C 36.9 C  SpO2: 96% 98%    Last Pain:  Vitals:   02/06/18 0720  TempSrc: Oral  PainSc:                  [name]

## 2018-03-12 ENCOUNTER — Other Ambulatory Visit: Payer: Self-pay | Admitting: Neurosurgery

## 2018-03-12 DIAGNOSIS — S129XXA Fracture of neck, unspecified, initial encounter: Secondary | ICD-10-CM

## 2018-03-13 ENCOUNTER — Ambulatory Visit
Admission: RE | Admit: 2018-03-13 | Discharge: 2018-03-13 | Disposition: A | Discharge status: Home / Self Care | Payer: 59 | Source: Ambulatory Visit | Attending: Neurosurgery | Admitting: Neurosurgery

## 2018-03-13 DIAGNOSIS — S129XXA Fracture of neck, unspecified, initial encounter: Secondary | ICD-10-CM

## 2018-03-14 ENCOUNTER — Other Ambulatory Visit: Payer: Self-pay | Admitting: Neurosurgery

## 2018-03-16 ENCOUNTER — Other Ambulatory Visit: Payer: Self-pay | Admitting: Neurosurgery

## 2018-03-19 ENCOUNTER — Encounter (HOSPITAL_COMMUNITY): Payer: Self-pay | Admitting: *Deleted

## 2018-03-19 NOTE — Progress Notes (Signed)
Denies chest pain or shortness of breath. Denies cardiology visit. States his blood sugar even when fasting is never less than 70.

## 2018-03-21 ENCOUNTER — Ambulatory Visit (HOSPITAL_COMMUNITY)
Admission: RE | Disposition: A | Discharge status: Home / Self Care | Payer: Self-pay | Source: Ambulatory Visit | Attending: Neurosurgery

## 2018-03-21 ENCOUNTER — Inpatient Hospital Stay (HOSPITAL_COMMUNITY): Payer: 59 | Admitting: Certified Registered Nurse Anesthetist

## 2018-03-21 ENCOUNTER — Inpatient Hospital Stay (HOSPITAL_COMMUNITY): Payer: 59

## 2018-03-21 ENCOUNTER — Other Ambulatory Visit: Payer: Self-pay

## 2018-03-21 ENCOUNTER — Encounter (HOSPITAL_COMMUNITY): Payer: Self-pay | Admitting: Certified Registered Nurse Anesthetist

## 2018-03-21 ENCOUNTER — Inpatient Hospital Stay (HOSPITAL_COMMUNITY)
Admission: RE | Admit: 2018-03-21 | Discharge: 2018-03-22 | DRG: 473 | Disposition: A | Discharge status: Home / Self Care | Payer: 59 | Source: Ambulatory Visit | Attending: Neurosurgery | Admitting: Neurosurgery

## 2018-03-21 DIAGNOSIS — M5013 Cervical disc disorder with radiculopathy, cervicothoracic region: Principal | ICD-10-CM | POA: Diagnosis present

## 2018-03-21 DIAGNOSIS — K219 Gastro-esophageal reflux disease without esophagitis: Secondary | ICD-10-CM | POA: Diagnosis present

## 2018-03-21 DIAGNOSIS — M62838 Other muscle spasm: Secondary | ICD-10-CM | POA: Diagnosis not present

## 2018-03-21 DIAGNOSIS — Z981 Arthrodesis status: Secondary | ICD-10-CM

## 2018-03-21 DIAGNOSIS — Z87891 Personal history of nicotine dependence: Secondary | ICD-10-CM

## 2018-03-21 DIAGNOSIS — I10 Essential (primary) hypertension: Secondary | ICD-10-CM | POA: Diagnosis present

## 2018-03-21 DIAGNOSIS — M5412 Radiculopathy, cervical region: Secondary | ICD-10-CM | POA: Diagnosis present

## 2018-03-21 DIAGNOSIS — M5010 Cervical disc disorder with radiculopathy, unspecified cervical region: Secondary | ICD-10-CM | POA: Diagnosis present

## 2018-03-21 DIAGNOSIS — M4804 Spinal stenosis, thoracic region: Secondary | ICD-10-CM | POA: Diagnosis present

## 2018-03-21 DIAGNOSIS — Z419 Encounter for procedure for purposes other than remedying health state, unspecified: Secondary | ICD-10-CM

## 2018-03-21 DIAGNOSIS — M4724 Other spondylosis with radiculopathy, thoracic region: Secondary | ICD-10-CM | POA: Diagnosis present

## 2018-03-21 DIAGNOSIS — Z7984 Long term (current) use of oral hypoglycemic drugs: Secondary | ICD-10-CM | POA: Diagnosis not present

## 2018-03-21 DIAGNOSIS — E119 Type 2 diabetes mellitus without complications: Secondary | ICD-10-CM | POA: Diagnosis present

## 2018-03-21 HISTORY — PX: POSTERIOR CERVICAL FUSION/FORAMINOTOMY: SHX5038

## 2018-03-21 HISTORY — DX: Type 2 diabetes mellitus without complications: E11.9

## 2018-03-21 LAB — TYPE AND SCREEN
ABO/RH(D): A POS
Antibody Screen: NEGATIVE

## 2018-03-21 LAB — BASIC METABOLIC PANEL
Anion gap: 12 (ref 5–15)
BUN: 9 mg/dL (ref 6–20)
CHLORIDE: 102 mmol/L (ref 98–111)
CO2: 25 mmol/L (ref 22–32)
Calcium: 9.6 mg/dL (ref 8.9–10.3)
Creatinine, Ser: 0.98 mg/dL (ref 0.61–1.24)
GFR calc Af Amer: >60 mL/min (ref >60)
GFR calc non Af Amer: >60 mL/min (ref >60)
GLUCOSE: 128 mg/dL — AB (ref 70–99)
POTASSIUM: 4.4 mmol/L (ref 3.5–5.1)
Sodium: 139 mmol/L (ref 135–145)

## 2018-03-21 LAB — GLUCOSE, CAPILLARY
Glucose-Capillary: 136 mg/dL — ABNORMAL HIGH (ref 70–99)
Glucose-Capillary: 162 mg/dL — ABNORMAL HIGH (ref 70–99)
Glucose-Capillary: 278 mg/dL — ABNORMAL HIGH (ref 70–99)
Glucose-Capillary: 351 mg/dL — ABNORMAL HIGH (ref 70–99)

## 2018-03-21 LAB — CBC
HEMATOCRIT: 41.1 % (ref 39.0–52.0)
HEMOGLOBIN: 14 g/dL (ref 13.0–17.0)
MCH: 29.1 pg (ref 26.0–34.0)
MCHC: 34.1 g/dL (ref 30.0–36.0)
MCV: 85.4 fL (ref 78.0–100.0)
PLATELETS: 291 10*3/uL (ref 150–400)
RBC: 4.81 MIL/uL (ref 4.22–5.81)
RDW: 12.3 % (ref 11.5–15.5)
WBC: 7 10*3/uL (ref 4.0–10.5)

## 2018-03-21 SURGERY — POSTERIOR CERVICAL FUSION/FORAMINOTOMY LEVEL 1
Anesthesia: General

## 2018-03-21 MED ORDER — THROMBIN 20000 UNITS EX SOLR
CUTANEOUS | Status: DC | PRN
  Administered 2018-03-21: 11:00:00 via TOPICAL

## 2018-03-21 MED ORDER — 0.9 % SODIUM CHLORIDE (POUR BTL) OPTIME
TOPICAL | Status: DC | PRN
  Administered 2018-03-21: 1000 mL

## 2018-03-21 MED ORDER — INSULIN ASPART 100 UNIT/ML ~~LOC~~ SOLN
0.0000 [IU] | Freq: Three times a day (TID) | SUBCUTANEOUS | Status: DC
  Administered 2018-03-22: 8 [IU] via SUBCUTANEOUS

## 2018-03-21 MED ORDER — MIDAZOLAM HCL 2 MG/2ML IJ SOLN
1.0000 mg | Freq: Once | INTRAMUSCULAR | Status: AC
  Administered 2018-03-21: 1 mg via INTRAVENOUS

## 2018-03-21 MED ORDER — FENTANYL CITRATE (PF) 100 MCG/2ML IJ SOLN
INTRAMUSCULAR | Status: AC
  Filled 2018-03-21: qty 2

## 2018-03-21 MED ORDER — SODIUM CHLORIDE 0.9 % IV SOLN: 250.0000 mL | INTRAVENOUS | Status: DC

## 2018-03-21 MED ORDER — LIDOCAINE HCL (CARDIAC) PF 100 MG/5ML IV SOSY
PREFILLED_SYRINGE | INTRAVENOUS | Status: DC | PRN
  Administered 2018-03-21: 100 mg via INTRAVENOUS

## 2018-03-21 MED ORDER — ACETAMINOPHEN 650 MG RE SUPP: 650.0000 mg | RECTAL | Status: DC | PRN

## 2018-03-21 MED ORDER — INSULIN ASPART 100 UNIT/ML ~~LOC~~ SOLN
0.0000 [IU] | Freq: Every day | SUBCUTANEOUS | Status: DC
  Administered 2018-03-21: 5 [IU] via SUBCUTANEOUS

## 2018-03-21 MED ORDER — SODIUM CHLORIDE 0.9% FLUSH: 3.0000 mL | INTRAVENOUS | Status: DC | PRN

## 2018-03-21 MED ORDER — MIDAZOLAM HCL 2 MG/2ML IJ SOLN
INTRAMUSCULAR | Status: AC
  Filled 2018-03-21: qty 2

## 2018-03-21 MED ORDER — ONDANSETRON HCL 4 MG/2ML IJ SOLN
INTRAMUSCULAR | Status: DC | PRN
  Administered 2018-03-21: 4 mg via INTRAVENOUS

## 2018-03-21 MED ORDER — HYDROCODONE-ACETAMINOPHEN 5-325 MG PO TABS
ORAL_TABLET | ORAL | Status: AC
  Filled 2018-03-21: qty 2

## 2018-03-21 MED ORDER — HYDROXYZINE HCL 50 MG/ML IM SOLN: 50.0000 mg | INTRAMUSCULAR | Status: DC | PRN

## 2018-03-21 MED ORDER — ACETAMINOPHEN 325 MG PO TABS: 650.0000 mg | ORAL_TABLET | ORAL | Status: DC | PRN

## 2018-03-21 MED ORDER — FENTANYL CITRATE (PF) 250 MCG/5ML IJ SOLN
INTRAMUSCULAR | Status: AC
  Filled 2018-03-21: qty 5

## 2018-03-21 MED ORDER — FENTANYL CITRATE (PF) 100 MCG/2ML IJ SOLN
INTRAMUSCULAR | Status: DC | PRN
  Administered 2018-03-21: 50 ug via INTRAVENOUS
  Administered 2018-03-21: 100 ug via INTRAVENOUS
  Administered 2018-03-21 (×4): 50 ug via INTRAVENOUS

## 2018-03-21 MED ORDER — KETOROLAC TROMETHAMINE 30 MG/ML IJ SOLN
INTRAMUSCULAR | Status: AC
  Filled 2018-03-21: qty 1

## 2018-03-21 MED ORDER — OXYCODONE HCL 5 MG PO TABS
7.5000 mg | ORAL_TABLET | Freq: Once | ORAL | Status: AC
  Administered 2018-03-21: 7.5 mg via ORAL

## 2018-03-21 MED ORDER — HYDROMORPHONE HCL 1 MG/ML IJ SOLN
0.2500 mg | INTRAMUSCULAR | Status: DC | PRN
  Administered 2018-03-21 (×4): 0.5 mg via INTRAVENOUS

## 2018-03-21 MED ORDER — PANTOPRAZOLE SODIUM 40 MG PO TBEC
40.0000 mg | DELAYED_RELEASE_TABLET | Freq: Every day | ORAL | Status: DC
  Administered 2018-03-22: 40 mg via ORAL
  Filled 2018-03-21: qty 1

## 2018-03-21 MED ORDER — BUPIVACAINE HCL (PF) 0.5 % IJ SOLN
INTRAMUSCULAR | Status: DC | PRN
  Administered 2018-03-21: 13 mL

## 2018-03-21 MED ORDER — LACTATED RINGERS IV SOLN
INTRAVENOUS | Status: DC
  Administered 2018-03-21 (×2): via INTRAVENOUS

## 2018-03-21 MED ORDER — KETOROLAC TROMETHAMINE 30 MG/ML IJ SOLN
30.0000 mg | Freq: Four times a day (QID) | INTRAMUSCULAR | Status: DC
  Administered 2018-03-21 – 2018-03-22 (×3): 30 mg via INTRAVENOUS
  Filled 2018-03-21 (×3): qty 1

## 2018-03-21 MED ORDER — FLEET ENEMA 7-19 GM/118ML RE ENEM: 1.0000 | ENEMA | Freq: Once | RECTAL | Status: DC | PRN

## 2018-03-21 MED ORDER — MIDAZOLAM HCL 5 MG/5ML IJ SOLN
INTRAMUSCULAR | Status: DC | PRN
  Administered 2018-03-21: 2 mg via INTRAVENOUS

## 2018-03-21 MED ORDER — HYDROXYZINE HCL 50 MG PO TABS
50.0000 mg | ORAL_TABLET | ORAL | Status: DC | PRN
  Filled 2018-03-21: qty 1

## 2018-03-21 MED ORDER — DEXAMETHASONE SODIUM PHOSPHATE 10 MG/ML IJ SOLN
INTRAMUSCULAR | Status: DC | PRN
  Administered 2018-03-21: 10 mg via INTRAVENOUS

## 2018-03-21 MED ORDER — LISINOPRIL 20 MG PO TABS
20.0000 mg | ORAL_TABLET | Freq: Every day | ORAL | Status: DC
  Administered 2018-03-21 – 2018-03-22 (×2): 20 mg via ORAL
  Filled 2018-03-21 (×2): qty 1

## 2018-03-21 MED ORDER — OXYCODONE-ACETAMINOPHEN 5-325 MG PO TABS
1.0000 | ORAL_TABLET | ORAL | Status: DC | PRN
  Administered 2018-03-21 – 2018-03-22 (×4): 2 via ORAL
  Filled 2018-03-21 (×4): qty 2

## 2018-03-21 MED ORDER — CHLORHEXIDINE GLUCONATE CLOTH 2 % EX PADS: 6.0000 | MEDICATED_PAD | Freq: Once | CUTANEOUS | Status: DC

## 2018-03-21 MED ORDER — PROPOFOL 10 MG/ML IV BOLUS
INTRAVENOUS | Status: DC | PRN
  Administered 2018-03-21: 100 mg via INTRAVENOUS

## 2018-03-21 MED ORDER — SODIUM CHLORIDE 0.9 % IV SOLN: INTRAVENOUS | Status: DC

## 2018-03-21 MED ORDER — SODIUM CHLORIDE 0.9 % IV SOLN
INTRAVENOUS | Status: DC | PRN
  Administered 2018-03-21: 25 ug/min via INTRAVENOUS

## 2018-03-21 MED ORDER — ONDANSETRON HCL 4 MG/2ML IJ SOLN
INTRAMUSCULAR | Status: AC
  Filled 2018-03-21: qty 2

## 2018-03-21 MED ORDER — PHENOL 1.4 % MT LIQD: 1.0000 | OROMUCOSAL | Status: DC | PRN

## 2018-03-21 MED ORDER — HEMOSTATIC AGENTS (NO CHARGE) OPTIME
TOPICAL | Status: DC | PRN
  Administered 2018-03-21: 1 via TOPICAL

## 2018-03-21 MED ORDER — BACITRACIN ZINC 500 UNIT/GM EX OINT
TOPICAL_OINTMENT | CUTANEOUS | Status: AC
  Filled 2018-03-21: qty 28.35

## 2018-03-21 MED ORDER — CYCLOBENZAPRINE HCL 10 MG PO TABS
ORAL_TABLET | ORAL | Status: AC
  Filled 2018-03-21: qty 1

## 2018-03-21 MED ORDER — ROCURONIUM BROMIDE 10 MG/ML (PF) SYRINGE
PREFILLED_SYRINGE | INTRAVENOUS | Status: DC | PRN
  Administered 2018-03-21 (×2): 50 mg via INTRAVENOUS
  Administered 2018-03-21 (×2): 10 mg via INTRAVENOUS

## 2018-03-21 MED ORDER — MORPHINE SULFATE (PF) 4 MG/ML IV SOLN
4.0000 mg | INTRAVENOUS | Status: DC | PRN
  Administered 2018-03-21: 4 mg via INTRAMUSCULAR
  Filled 2018-03-21: qty 1

## 2018-03-21 MED ORDER — LIDOCAINE-EPINEPHRINE 1 %-1:100000 IJ SOLN
INTRAMUSCULAR | Status: DC | PRN
  Administered 2018-03-21: 13 mL

## 2018-03-21 MED ORDER — OXYCODONE HCL 5 MG PO TABS
ORAL_TABLET | ORAL | Status: AC
  Administered 2018-03-21: 7.5 mg via ORAL
  Filled 2018-03-21: qty 1

## 2018-03-21 MED ORDER — MENTHOL 3 MG MT LOZG: 1.0000 | LOZENGE | OROMUCOSAL | Status: DC | PRN

## 2018-03-21 MED ORDER — MIDAZOLAM HCL 2 MG/2ML IJ SOLN
INTRAMUSCULAR | Status: AC
  Administered 2018-03-21: 1 mg via INTRAVENOUS
  Filled 2018-03-21: qty 2

## 2018-03-21 MED ORDER — CYCLOBENZAPRINE HCL 10 MG PO TABS
10.0000 mg | ORAL_TABLET | Freq: Three times a day (TID) | ORAL | Status: DC | PRN
  Administered 2018-03-21: 10 mg via ORAL

## 2018-03-21 MED ORDER — SODIUM CHLORIDE 0.9 % IV SOLN
INTRAVENOUS | Status: DC | PRN
  Administered 2018-03-21: 11:00:00

## 2018-03-21 MED ORDER — MAGNESIUM HYDROXIDE 400 MG/5ML PO SUSP: 30.0000 mL | Freq: Every day | ORAL | Status: DC | PRN

## 2018-03-21 MED ORDER — BUPIVACAINE HCL (PF) 0.5 % IJ SOLN
INTRAMUSCULAR | Status: AC
  Filled 2018-03-21: qty 30

## 2018-03-21 MED ORDER — METHOCARBAMOL 750 MG PO TABS
750.0000 mg | ORAL_TABLET | Freq: Four times a day (QID) | ORAL | Status: DC | PRN
  Administered 2018-03-21 – 2018-03-22 (×2): 750 mg via ORAL
  Filled 2018-03-21 (×2): qty 1

## 2018-03-21 MED ORDER — FENTANYL CITRATE (PF) 100 MCG/2ML IJ SOLN
50.0000 ug | Freq: Once | INTRAMUSCULAR | Status: AC
  Administered 2018-03-21: 50 ug via INTRAVENOUS

## 2018-03-21 MED ORDER — LIDOCAINE-EPINEPHRINE 1 %-1:100000 IJ SOLN
INTRAMUSCULAR | Status: AC
  Filled 2018-03-21: qty 1

## 2018-03-21 MED ORDER — KETOROLAC TROMETHAMINE 30 MG/ML IJ SOLN
30.0000 mg | Freq: Once | INTRAMUSCULAR | Status: AC
  Administered 2018-03-21: 30 mg via INTRAVENOUS

## 2018-03-21 MED ORDER — SUGAMMADEX SODIUM 200 MG/2ML IV SOLN
INTRAVENOUS | Status: DC | PRN
  Administered 2018-03-21: 200 mg via INTRAVENOUS

## 2018-03-21 MED ORDER — EPHEDRINE 5 MG/ML INJ
INTRAVENOUS | Status: AC
  Filled 2018-03-21: qty 10

## 2018-03-21 MED ORDER — PROPOFOL 10 MG/ML IV BOLUS
INTRAVENOUS | Status: AC
  Filled 2018-03-21: qty 20

## 2018-03-21 MED ORDER — OXYCODONE-ACETAMINOPHEN 5-325 MG PO TABS
ORAL_TABLET | ORAL | Status: AC
  Filled 2018-03-21: qty 2

## 2018-03-21 MED ORDER — SODIUM CHLORIDE 0.9% FLUSH: 3.0000 mL | Freq: Two times a day (BID) | INTRAVENOUS | Status: DC

## 2018-03-21 MED ORDER — CEFAZOLIN SODIUM-DEXTROSE 2-4 GM/100ML-% IV SOLN
2.0000 g | INTRAVENOUS | Status: AC
  Administered 2018-03-21: 2 g via INTRAVENOUS
  Filled 2018-03-21: qty 100

## 2018-03-21 MED ORDER — ATORVASTATIN CALCIUM 10 MG PO TABS
10.0000 mg | ORAL_TABLET | Freq: Every day | ORAL | Status: DC
  Administered 2018-03-21 – 2018-03-22 (×2): 10 mg via ORAL
  Filled 2018-03-21 (×2): qty 1

## 2018-03-21 MED ORDER — HYDROCODONE-ACETAMINOPHEN 5-325 MG PO TABS
1.0000 | ORAL_TABLET | ORAL | Status: DC | PRN
  Administered 2018-03-21 (×2): 2 via ORAL
  Filled 2018-03-21: qty 2

## 2018-03-21 MED ORDER — BACITRACIN ZINC 500 UNIT/GM EX OINT
TOPICAL_OINTMENT | CUTANEOUS | Status: DC | PRN
  Administered 2018-03-21: 1 via TOPICAL

## 2018-03-21 MED ORDER — ALUM & MAG HYDROXIDE-SIMETH 200-200-20 MG/5ML PO SUSP
30.0000 mL | Freq: Four times a day (QID) | ORAL | Status: DC | PRN
  Administered 2018-03-22: 30 mL via ORAL
  Filled 2018-03-21: qty 30

## 2018-03-21 MED ORDER — THROMBIN (RECOMBINANT) 20000 UNITS EX SOLR
CUTANEOUS | Status: AC
  Filled 2018-03-21: qty 20000

## 2018-03-21 MED ORDER — FENTANYL CITRATE (PF) 100 MCG/2ML IJ SOLN
50.0000 ug | Freq: Once | INTRAMUSCULAR | Status: AC
  Administered 2018-03-21: 50 ug via INTRAVENOUS
  Filled 2018-03-21: qty 2

## 2018-03-21 MED ORDER — BISACODYL 10 MG RE SUPP: 10.0000 mg | Freq: Every day | RECTAL | Status: DC | PRN

## 2018-03-21 MED ORDER — SUCCINYLCHOLINE CHLORIDE 200 MG/10ML IV SOSY
PREFILLED_SYRINGE | INTRAVENOUS | Status: AC
  Filled 2018-03-21: qty 10

## 2018-03-21 MED ORDER — PHENYLEPHRINE 40 MCG/ML (10ML) SYRINGE FOR IV PUSH (FOR BLOOD PRESSURE SUPPORT)
PREFILLED_SYRINGE | INTRAVENOUS | Status: DC | PRN
  Administered 2018-03-21: 120 ug via INTRAVENOUS
  Administered 2018-03-21 (×2): 80 ug via INTRAVENOUS

## 2018-03-21 MED ORDER — METFORMIN HCL ER 750 MG PO TB24
1500.0000 mg | ORAL_TABLET | Freq: Every day | ORAL | Status: DC
  Administered 2018-03-21: 1500 mg via ORAL
  Filled 2018-03-21 (×2): qty 2

## 2018-03-21 MED ORDER — OXYCODONE HCL 5 MG PO TABS
ORAL_TABLET | ORAL | Status: AC
  Filled 2018-03-21: qty 2

## 2018-03-21 MED ORDER — GABAPENTIN 300 MG PO CAPS
300.0000 mg | ORAL_CAPSULE | Freq: Three times a day (TID) | ORAL | Status: DC
  Administered 2018-03-21 – 2018-03-22 (×3): 300 mg via ORAL
  Filled 2018-03-21 (×3): qty 1

## 2018-03-21 MED ORDER — HYDROMORPHONE HCL 1 MG/ML IJ SOLN
INTRAMUSCULAR | Status: AC
  Filled 2018-03-21: qty 2

## 2018-03-21 SURGICAL SUPPLY — 74 items
BAG DECANTER FOR FLEXI CONT (MISCELLANEOUS) ×2 IMPLANT
BENZOIN TINCTURE PRP APPL 2/3 (GAUZE/BANDAGES/DRESSINGS) IMPLANT
BIT DRILL NEURO 2X3.1 SFT TUCH (MISCELLANEOUS) ×1 IMPLANT
BIT DRILL WIRE PASS 1.3MM (BIT) ×1 IMPLANT
BLADE CLIPPER SURG (BLADE) ×2 IMPLANT
BLOCKER OASYS (Neuro Prosthesis/Implant) ×4 IMPLANT
CABLE SNG STERILE W/CRIMP (MISCELLANEOUS) ×2 IMPLANT
CANISTER SUCT 3000ML PPV (MISCELLANEOUS) ×2 IMPLANT
CARTRIDGE OIL MAESTRO DRILL (MISCELLANEOUS) ×1 IMPLANT
COVER BACK TABLE 60X90IN (DRAPES) ×2 IMPLANT
DECANTER SPIKE VIAL GLASS SM (MISCELLANEOUS) ×4 IMPLANT
DERMABOND ADVANCED (GAUZE/BANDAGES/DRESSINGS) ×1
DERMABOND ADVANCED .7 DNX12 (GAUZE/BANDAGES/DRESSINGS) ×1 IMPLANT
DIFFUSER DRILL AIR PNEUMATIC (MISCELLANEOUS) ×2 IMPLANT
DRAPE C-ARM 42X72 X-RAY (DRAPES) ×4 IMPLANT
DRAPE HALF SHEET 40X57 (DRAPES) IMPLANT
DRAPE LAPAROTOMY 100X72 PEDS (DRAPES) ×2 IMPLANT
DRAPE MICROSCOPE LEICA (MISCELLANEOUS) ×2 IMPLANT
DRAPE POUCH INSTRU U-SHP 10X18 (DRAPES) ×2 IMPLANT
DRILL NEURO 2X3.1 SOFT TOUCH (MISCELLANEOUS) ×2
DRILL OASYS 2.5MM (BIT) ×1 IMPLANT
DRILL WIRE PASS 1.3MM (BIT) ×2
DRIUS OASYS 2.5MM (BIT) ×2
DRSG EMULSION OIL 3X3 NADH (GAUZE/BANDAGES/DRESSINGS) ×2 IMPLANT
ELECT REM PT RETURN 9FT ADLT (ELECTROSURGICAL) ×2
ELECTRODE REM PT RTRN 9FT ADLT (ELECTROSURGICAL) ×1 IMPLANT
EVACUATOR 1/8 PVC DRAIN (DRAIN) IMPLANT
GAUZE 4X4 16PLY RFD (DISPOSABLE) IMPLANT
GAUZE SPONGE 4X4 12PLY STRL (GAUZE/BANDAGES/DRESSINGS) ×2 IMPLANT
GLOVE BIOGEL PI IND STRL 8 (GLOVE) ×1 IMPLANT
GLOVE BIOGEL PI INDICATOR 8 (GLOVE) ×1
GLOVE ECLIPSE 7.5 STRL STRAW (GLOVE) ×2 IMPLANT
GLOVE EXAM NITRILE LRG STRL (GLOVE) IMPLANT
GLOVE EXAM NITRILE XL STR (GLOVE) IMPLANT
GLOVE EXAM NITRILE XS STR PU (GLOVE) IMPLANT
GOWN STRL REUS W/ TWL LRG LVL3 (GOWN DISPOSABLE) ×1 IMPLANT
GOWN STRL REUS W/ TWL XL LVL3 (GOWN DISPOSABLE) IMPLANT
GOWN STRL REUS W/TWL 2XL LVL3 (GOWN DISPOSABLE) IMPLANT
GOWN STRL REUS W/TWL LRG LVL3 (GOWN DISPOSABLE) ×1
GOWN STRL REUS W/TWL XL LVL3 (GOWN DISPOSABLE)
HEMOSTAT SURGICEL 2X14 (HEMOSTASIS) IMPLANT
IV CATH AUTO 14GX1.75 SAFE ORG (IV SOLUTION) ×2 IMPLANT
KIT BASIN OR (CUSTOM PROCEDURE TRAY) ×2 IMPLANT
KIT INFUSE XX SMALL 0.7CC (Orthopedic Implant) ×2 IMPLANT
KIT TURNOVER KIT B (KITS) ×2 IMPLANT
NEEDLE SPNL 18GX3.5 QUINCKE PK (NEEDLE) ×2 IMPLANT
NEEDLE SPNL 22GX3.5 QUINCKE BK (NEEDLE) ×2 IMPLANT
NS IRRIG 1000ML POUR BTL (IV SOLUTION) ×2 IMPLANT
OIL CARTRIDGE MAESTRO DRILL (MISCELLANEOUS) ×2
PACK LAMINECTOMY NEURO (CUSTOM PROCEDURE TRAY) ×2 IMPLANT
PACK VITOSS BIOACTIVE 2.5CC (Neuro Prosthesis/Implant) ×2 IMPLANT
PAD ARMBOARD 7.5X6 YLW CONV (MISCELLANEOUS) ×6 IMPLANT
PIN MAYFIELD SKULL DISP (PIN) ×2 IMPLANT
ROD OASYS 3.5X40MM (Rod) ×2 IMPLANT
RUBBERBAND STERILE (MISCELLANEOUS) ×4 IMPLANT
SCREW BIASED ANGLE 3.5X24 (Screw) ×4 IMPLANT
SPONGE LAP 4X18 RFD (DISPOSABLE) IMPLANT
SPONGE SURGIFOAM ABS GEL 100 (HEMOSTASIS) ×2 IMPLANT
STAPLER SKIN PROX WIDE 3.9 (STAPLE) ×2 IMPLANT
SURGIFLO W/THROMBIN 8M KIT (HEMOSTASIS) ×2 IMPLANT
SUT ETHILON 3 0 FSL (SUTURE) IMPLANT
SUT VIC AB 0 CT1 18XCR BRD8 (SUTURE) ×2 IMPLANT
SUT VIC AB 0 CT1 8-18 (SUTURE) ×2
SUT VIC AB 2-0 CP2 18 (SUTURE) ×4 IMPLANT
SUT VIC AB 3-0 SH 8-18 (SUTURE) IMPLANT
SYR CONTROL 10ML LL (SYRINGE) ×2 IMPLANT
TAP SURG 3.5M DIA SPINAL CANC (TAP) ×2 IMPLANT
TAPE CLOTH 3X10 TAN LF (GAUZE/BANDAGES/DRESSINGS) ×2 IMPLANT
TAPE CLOTH SURG 6X10 WHT LF (GAUZE/BANDAGES/DRESSINGS) ×2 IMPLANT
TOWEL GREEN STERILE (TOWEL DISPOSABLE) ×2 IMPLANT
TOWEL GREEN STERILE FF (TOWEL DISPOSABLE) ×2 IMPLANT
TRAY FOLEY MTR SLVR 16FR STAT (SET/KITS/TRAYS/PACK) IMPLANT
UNDERPAD 30X30 (UNDERPADS AND DIAPERS) ×2 IMPLANT
WATER STERILE IRR 1000ML POUR (IV SOLUTION) ×2 IMPLANT

## 2018-03-21 NOTE — Anesthesia Preprocedure Evaluation (Addendum)
Anesthesia Evaluation  Patient identified by MRN, date of birth, ID band Patient awake    Reviewed: Allergy & Precautions, NPO status , Patient's Chart, lab work & pertinent test results  Airway Mallampati: II  TM Distance: >3 FB Neck ROM: Limited    Dental   Pulmonary former smoker,    Pulmonary exam normal        Cardiovascular hypertension, Pt. on medications Normal cardiovascular exam     Neuro/Psych    GI/Hepatic GERD  Medicated and Controlled,  Endo/Other  diabetes, Type 2, Oral Hypoglycemic Agents  Renal/GU      Musculoskeletal   Abdominal   Peds  Hematology   Anesthesia Other Findings   Reproductive/Obstetrics                            Anesthesia Physical Anesthesia Plan  ASA: II  Anesthesia Plan: General   Post-op Pain Management:    Induction: Intravenous  PONV Risk Score and Plan: 2 and Treatment may vary due to age or medical condition  Airway Management Planned: Oral ETT  Additional Equipment:   Intra-op Plan:   Post-operative Plan: Extubation in OR  Informed Consent: I have reviewed the patients History and Physical, chart, labs and discussed the procedure including the risks, benefits and alternatives for the proposed anesthesia with the patient or authorized representative who has indicated his/her understanding and acceptance.     Plan Discussed with: CRNA and Surgeon  Anesthesia Plan Comments:        Anesthesia Quick Evaluation

## 2018-03-21 NOTE — Anesthesia Procedure Notes (Signed)
Procedure Name: Intubation Date/Time: 03/21/2018 10:14 AM Performed by: Carney Living, CRNA Pre-anesthesia Checklist: Patient identified, Emergency Drugs available, Suction available, Patient being monitored and Timeout performed Patient Re-evaluated:Patient Re-evaluated prior to induction Oxygen Delivery Method: Circle system utilized Preoxygenation: Pre-oxygenation with 100% oxygen Induction Type: IV induction Ventilation: Mask ventilation without difficulty Laryngoscope Size: Mac and 4 Grade View: Grade I Tube type: Oral Tube size: 7.5 mm Number of attempts: 1 Airway Equipment and Method: Stylet Placement Confirmation: ETT inserted through vocal cords under direct vision,  positive ETCO2 and breath sounds checked- equal and bilateral Secured at: 23 cm Tube secured with: Tape Dental Injury: Teeth and Oropharynx as per pre-operative assessment  Comments: Pt induction, mask, and DL placement performed with very minimal neck extension.

## 2018-03-21 NOTE — Op Note (Signed)
03/21/2018  1:15 PM  PATIENT:  Lee Kramer  52 y.o. male  PRE-OPERATIVE DIAGNOSIS: Right C8 radiculopathy, right C7-T1 neuroforaminal stenosis, possible right C7-T1 cervical disc herniation, cervical thoracic spondylosis, cervical thoracic degenerative disc disease  POST-OPERATIVE DIAGNOSIS:  Right C8 radiculopathy, right C7-T1 neuroforaminal stenosis, right C7-T1 cervical disc herniation, cervical thoracic spondylosis, cervical thoracic degenerative disc disease  PROCEDURE:  Procedure(s): 1) Right C7-T1 cervical thoracic laminotomy, foraminotomy, and microdiscectomy with microdissection, microsurgical technique, and the operating microscope; 2) C7-T1 posterior cervical thoracic arthrodesis with nonsegmental right C7-T1 Oasys posterior instrumentation including pedicle screw fixation, rods and caps, a C7-T1 Songer cable, Vitoss BA with bone marrow aspirate, and infuse  SURGEON: Shirlean Kelly, MD  ASSISTANTS: Tressie Stalker, MD  ANESTHESIA:   general  EBL:  Total I/O In: 1000 [I.V.:1000] Out: -  EBL:  250 cc  BLOOD ADMINISTERED:none  COUNT:  Correct per nursing staff  DICTATION: Patient was brought to the operating room, placed under general endotracheal anesthesia.  Radiolucent 3 pin Mayfield head holderwas applied, and the patient was turned to a prone position. The posterior neck and upper back were prepped with Betadine soap and solution and draped in a sterile fashion. The midline was infiltrated with local anesthetic with epinephrine.  The C-arm fluoroscope was used throughout the procedure to guide localization and screw placement. The C7-T1 level was identified.  A midline incision was made over the C7-T1 level, and carried down through the subcutaneous tissue. Bipolar cautery and electrocautery used to maintain hemostasis.  Posterior cervical thoracic fascia was incised bilaterally, and the paraspinal musculature was dissected from the spinous processes and lamina in a  subperiosteal fashion. Self-retaining retractor was placed. Using the C-arm fluoroscope the C7-T1 level.   The operating microscope was draped and brought in the field to provide additional magnification, illumination, and visualization, and the decompression of the right C7-T1 neural foramen and the right C8 nerve root was performed using microdissection microsurgical technique.  A right C7-T1 laminotomy, partial facetectomy, and foraminotomy was performed using the high-speed drill and 2 mm Kerrison punch with a thin footplate.  The lateral portion of the thecal sac and the exiting right C8 nerve root were identified.  Spondylitic overgrowth was carefully removed decompressing the posterior aspect of the neuroforamen.  Overlying epidural veins were coagulated and divided as necessary.  The right C8 nerve root was gently mobilized rostrally, and the right C7-T1 posterior cervical annulus verified.  As we examined it medially, we found a disc herniation protruding through the annulus compressing the ventral aspect of the exiting right C8 nerve root.  The remaining annular fibers were incised, and the disc herniation removed.  Additional immediately adjacent degenerated disc material was removed as well.  Good decompression of the exiting right C8 nerve root was achieved.  Hemostasis was established with the use of Gelfoam with thrombin and Surgi-Flo.  The Gelfoam with thrombin was removed, and hemostasis confirmed.  We then proceeded with the C7-T1 posterior cervical thoracic arthrodesis.  The pedicles on the right side of C7 and T1 were identified and posterior entry points to the pedicles were identified.  Pilot holes were made using the high-speed drill with the wire passer bur. Each screw hole was then drilled, with C-arm fluoroscopic guidance in AP and lateral projections, using the hand drill and a drill guide there was gradually set to increasing depths, and eventually drilled to 24 mm. Each screw hole  was examined with the ball probe. Good bony surfaces were noted. C-arm  fluoroscopy confirmed good screw hole positioning, and the posterior cortex of each screw hole was tapped, and 24 mm screws were placed in the pedicle and vertebral body of C7 and T1 on the right side.  We then created a hole through the base of the spinous process of C7 and passed a Songer cable through the spinous process C7 and around the base of the inferior aspect of the spinous process of T1.  There was gradually tightened under direct visualization, and then the crimped, and the cable cut above the crimp.  We then selected a 40 mm rod, that was placed within the screw heads.  Locking caps were placed.  And final tightening of the locking caps was performed against a counter torque.  The left C7 and T1 laminar and spinous process surfaces decorticated with the high-speed drill and a combination of Vitoss PA with bone marrow aspirate and infuse was packed over the decorticated laminar surfaces and packed between the C7 and T1 spinous processes.  The wound was again irrigated with saline solution, hemostasis was established and confirmed.  We then proceeded with closure. Deep fascia was closed with interrupted undyed 0 Vicryl sutures. Scarpa's fascia was closed with interrupted undyed 0 Vicryl sutures. The subcutaneous and subcuticular closed with interrupted inverted 2-0 undyed Vicryl sutures.  Skin edges were closed with surgical staples. The wound was dressed with Adaptic, dressing sponges, and Hypafix.  Following surgery the patient was turned back to a supine position, the 3 pin Mayfield head holder was removed, a single staple was necessary to close a pin site, and the patient is to be reversed and the anesthetic, extubated, and transferred to the recovery room for further care.  PLAN OF CARE: Admit for overnight observation  PATIENT DISPOSITION:  PACU - hemodynamically stable.   Delay start of Pharmacological VTE agent (>24hrs)  due to surgical blood loss or risk of bleeding:  yes

## 2018-03-21 NOTE — H&P (Addendum)
Subjective: Patient is a 52 y.o. left-handed white male who is admitted for treatment of disabling right C8 cervical radiculopathy.  Patient is most recently 6 weeks status post a 4 anterior cervical decompression and arthrodesis.  He is status post a previous 3 level C4-5 C5-6 and C6-7 anterior cervical decompression [place]des[place] in [place] 2011.  Following his surgery last month he is developed disabling pain from the right side of his neck, down to the right paraspinal region, around the right scapula, and into the posterior aspect of the right shoulder, arm, forearm, and extending into the fourth and fifth digits of the right hand.  He has to support the right upper extremity with his left hand to help ease the pain.  He is been using Percocet 7.5/325 1 every 4 hours to take the edge off the pain.  He has been worked up with MRI and CT of the cervical spine which revealed severe right C7-T1 neuroforaminal stenosis.  It appears to be primarily spondylitic although we could not exclude the possibility of a superimposed disc herniation.  Patient is admitted now for a right C7-T1 cervical laminotomy, foraminotomy, and possible microdiscectomy, and a C7-T1 posterior cervical arthrodesis with posterior instrumentation and bone graft.   Patient Active Problem List   Diagnosis Date Noted  . HNP (herniated nucleus pulposus), cervical 02/05/2018   Past Medical History:  Diagnosis Date  . Diabetes mellitus without complication (HCC)   . GERD (gastroesophageal reflux disease)   . Hypertension   . Pneumonia 2013  . Type 2 diabetes mellitus (HCC)     Past Surgical History:  Procedure Laterality Date  . ANTERIOR CERVICAL DECOMP/DISCECTOMY FUSION N/A 02/05/2018   Procedure: ANTERIOR CERVICAL DECOMPRESSION/DISCECTOMY FUSION CERVICAL THREE- CERVICAL FOUR;  Surgeon: Shirlean KellyNudelman, Robert, MD;  [facility];  Service: Neurosurgery;  Laterality: N/A;  . ANTERIOR FUSION CERVICAL SPINE    . BACK SURGERY    .  COLONOSCOPY    . ELBOW SURGERY    . ROTATOR CUFF REPAIR    . SHOULDER ARTHROSCOPY WITH DISTAL CLAVICLE RESECTION Left 09/10/2015   Procedure: SHOULDER ARTHROSCOPY WITH DISTAL CLAVICLE RESECTION DEBRIDE LABRUM DEBRIDEMENT CALCIFIC ROTATOR CUFF;  Surgeon: Loreta Aveaniel F Murphy, MD;  Location: [facility];  Service: Orthopedics;  Laterality: Left;  . TONSILLECTOMY      No medications prior to admission.   No Known Allergies  Social History   Tobacco Use  . Smoking status: Former Smoker    Last attempt to quit: 01/31/2011    Years since quitting: 7.1  . Smokeless tobacco: Never Used  Substance Use Topics  . Alcohol use: Yes    Comment: reports once every couple of weeks    Family History  Adopted: Yes     Review of Systems Pertinent items noted in HPI and remainder of comprehensive ROS otherwise negative.  EXAM: Patient is a well-developed well nourished white male in significant pain, but no acute distress. Lungs are clear to auscultation , the patient has symmetrical respiratory excursion. Heart has a regular rate and rhythm normal S1 and S2 no murmur.   Abdomen is soft nontender nondistended bowel sounds are present. Extremity examination shows no clubbing cyanosis or edema. Motor examination shows the strength is 5/5 in the deltoid, biceps, and triceps bilaterally, as well as in the left intrinsics and grip.  However motor exam shows the right intrinsics are 4/5 and the right grip is 4-/5.  Sensation is intact to pinprick.  Data Review:CBC    Component Value  Date/Time   WBC 9.9 01/30/2018 0849   RBC 5.00 01/30/2018 0849   HGB 14.7 01/30/2018 0849   HCT 44.2 01/30/2018 0849   PLT 287 01/30/2018 0849   MCV 88.4 01/30/2018 0849   MCH 29.4 01/30/2018 0849   MCHC 33.3 01/30/2018 0849   RDW 12.4 01/30/2018 0849                          BMET    Component Value Date/Time   NA 139 01/30/2018 0849   K 4.9 01/30/2018 0849   CL 101 01/30/2018 0849   CO2 27 01/30/2018 0849    GLUCOSE 118 (H) 01/30/2018 0849   BUN 10 01/30/2018 0849   CREATININE 1.11 01/30/2018 0849   CALCIUM 9.6 01/30/2018 0849   GFRNONAA NOT CALCULATED 01/30/2018 0849   GFRAA NOT CALCULATED 01/30/2018 0849     Assessment/Plan: Patient with disabling right C8 cervical radiculopathy, who is admitted for a right C7-T1 cervical laminotomy, foraminotomy, and possible microdiscectomy and a C7-T1 posterior cervical arthrodesis with posterior instrumentation and bone graft.  I've discussed with the patient the nature of his condition, the nature the surgical procedure, the typical length of surgery, hospital stay, and overall recuperation. We discussed limitations postoperatively. I discussed risks of surgery including risks of infection, bleeding, possibly need for transfusion, the risk of nerve root dysfunction with pain, weakness, numbness, or paresthesias, the risk of spinal cord dysfunction with paralysis of all 4 limbs and quadriplegia, and the risk of dural tear and CSF leakage and possible need for further surgery, the risk of failure of the arthrodesis and the possible need for further surgery, and the risk of anesthetic complications including myocardial infarction, stroke, pneumonia, and death. We also discussed the need for postoperative immobilization in a cervical collar. Understanding all this the patient does wish to proceed with surgery and is admitted for such.  Hewitt Shorts, MD 03/21/2018 7:26 AM

## 2018-03-21 NOTE — Transfer of Care (Signed)
Immediate Anesthesia Transfer of Care Note  Patient: Lee BushyJeffrey Jay Kramer  Procedure(s) Performed: RIGHT CERVICAL SEVEN- THORACIC ONE POSTERIOR CERVICAL LAMINOTOMY,FORAMINOTOMY AND MICRODISCECTOMY; BILATERAL CERVICAL SEVEN-THORACIC ONE POSTERIOR CERVICAL ARTHRODESIS (N/A )  Patient Location: PACU  Anesthesia Type:General  Level of Consciousness: awake, alert , oriented and patient cooperative  Airway & Oxygen Therapy: Patient Spontanous Breathing and Patient connected to nasal cannula oxygen  Post-op Assessment: Report given to RN, Post -op Vital signs reviewed and stable and Patient moving all extremities X 4  Post vital signs: Reviewed and stable  Last Vitals:  Vitals Value Taken Time  BP 151/95 03/21/2018  1:43 PM  Temp 36.2 C 03/21/2018  1:40 PM  Pulse 89 03/21/2018  1:47 PM  Resp 16 03/21/2018  1:47 PM  SpO2 91 % 03/21/2018  1:47 PM  Vitals shown include unvalidated device data.  Last Pain:  Vitals:   03/21/18 1340  TempSrc:   PainSc: (P) 9       Patients Stated Pain Goal: 5 (03/21/18 0948)  Complications: No apparent anesthesia complications

## 2018-03-21 NOTE — Progress Notes (Signed)
Report given to heather bowman rn as caregiver 

## 2018-03-21 NOTE — Progress Notes (Signed)
Patient complaining of 9/10 pain.  Dr. Michelle Piperssey made aware and received verbal order to give 7.5 mg of Oxy IR.  Dr. Michelle Piperssey came to assess patient and gave nurse verbal order to give 50 mcg Fentanyl, 1 mg Versed and to assess patient after 10 minutes and can give another 50 mcg Fentanyl if needed.  Nurse and wife at bedside.  Will continue to monitor patient.

## 2018-03-21 NOTE — Progress Notes (Signed)
Vitals:   03/21/18 1445 03/21/18 1450 03/21/18 1520 03/21/18 2003  BP: 104/87  133/89 108/67  Pulse: 99 99 (!) 104 (!) 102  Resp: (!) 35  (!) 22 18  Temp:   98.9 F (37.2 C) 98.6 F (37 C)  TempSrc:   Oral Oral  SpO2: 96% 97% 96% 94%  Weight:      Height:        CBC Recent Labs    03/21/18 0835  WBC 7.0  HGB 14.0  HCT 41.1  PLT 291   BMET Recent Labs    03/21/18 0835  NA 139  K 4.4  CL 102  CO2 25  GLUCOSE 128*  BUN 9  CREATININE 0.98  CALCIUM 9.6    Patient resting in bed, with significant posterior neck and upper back pain and spasm.  The pain in the posterior neck and upper back limits his ability to lift his arms up at the level of the shoulder due to pain.  He has been up and ambulated in the halls twice.  He is voiding well.  He has had good relief of the disabling right C8 radicular pain, but notes some increased weakness in the intrinsic musculature of the right hand.  Plan: At the patient's and his wife's request, his pain medication has been changed from hydrocodone to oxycodone.  We have also changed his muscle relaxant from Flexeril to Robaxin, because he is explained that the Flexeril does not tend to give him any relief of muscular spasm.  I did discuss with the patient and his wife the risks of physiologic habituation to opioids, and the need to be judicious in their use.  He has been encouraged to continue to ambulate in the halls.  Hewitt ShortsNUDELMAN,ROBERT W, MD 03/21/2018, 8:20 PM

## 2018-03-22 LAB — GLUCOSE, CAPILLARY: Glucose-Capillary: 294 mg/dL — ABNORMAL HIGH (ref 70–99)

## 2018-03-22 MED ORDER — OXYCODONE-ACETAMINOPHEN 5-325 MG PO TABS: 1.0000 | ORAL_TABLET | ORAL | 0 refills | Status: AC | PRN

## 2018-03-22 MED ORDER — SODIUM CHLORIDE 0.9 % IV BOLUS
500.0000 mL | Freq: Once | INTRAVENOUS | Status: AC
  Administered 2018-03-22: 500 mL via INTRAVENOUS

## 2018-03-22 MED ORDER — METHOCARBAMOL 750 MG PO TABS: 750.0000 mg | ORAL_TABLET | Freq: Four times a day (QID) | ORAL | 0 refills | Status: AC | PRN

## 2018-03-22 MED FILL — Thrombin (Recombinant) For Soln 20000 Unit: CUTANEOUS | Qty: 1 | Status: AC

## 2018-03-22 NOTE — Progress Notes (Signed)
Patient alert and oriented, mae's well, voiding adequate amount of urine, swallowing without difficulty,  c/o pain at time of dischargemedication and ice pack given prior to discharged. Patient discharged home with family. Script and discharged instructions given to patient. Patient and family stated understanding of instructions given. Patient has an appointment with Dr. Newell CoralNudelman

## 2018-03-22 NOTE — Discharge Summary (Signed)
Physician Discharge Summary  Patient ID: Lee Kramer MRN: [id number] DOB/AGE: 1966/03/03 52 y.o.  Admit date: 03/21/2018 Discharge date: 03/22/2018  Admission Diagnoses:  Right C8 radiculopathy, right C7-T1 neuroforaminal stenosis, possible right C7-T1 cervical disc herniation, cervical thoracic spondylosis, cervical thoracic degenerative disc disease  Discharge Diagnoses:  Right C8 radiculopathy, right C7-T1 neuroforaminal stenosis, right C7-T1 cervical disc herniation, cervical thoracic spondylosis, cervical thoracic degenerative disc disease Active Problems:   Cervical radiculopathy at C8   Discharged Condition: good  Hospital Course: Patient was admitted, underwent a right C7-T1 cervical thoracic laminotomy, foraminotomy, and microdiscectomy and a C7-T1 posterior cervical thoracic arthrodesis.  Postoperatively he had good relief of the disabling right C8 radicular pain, but had significant incisional pain and also some increased right C8 weakness (there was some weakness of the intrinsics and grip preoperatively, but it is more pronounced operatively).  He and his wife have been given instructions regarding exercise and range of motion for the right hand as well as range of motion exercises for the upper extremities in general.  They have been given instructions regarding wound care and activities following discharge.  He is scheduled to return for staple removal a week from tomorrow.  Discharge Exam: Blood pressure 112/77, pulse 84, temperature 98.6 F (37 C), temperature source Oral, resp. rate 20, height 6\' 1"  (1.854 m), weight 102.1 kg, SpO2 97 %.  Disposition: Discharge disposition: 01-Home or Self Care       Discharge Instructions    Discharge wound care:   Complete by:  As directed    Remove dressing the day after discharge, and leave the wound open to air. Shower daily with the wound uncovered. Water and soapy water should run over the incision area. Do not wash  directly on the incision for 2 weeks.   Driving Restrictions   Complete by:  As directed    No driving for 2 weeks. May ride in the car locally now. May begin to drive locally in 2 weeks.   Other Restrictions   Complete by:  As directed    Walk gradually increasing distances out in the fresh air at least twice a day. Walking additional 6 times inside the house, gradually increasing distances, daily. No bending, lifting, or twisting. Perform activities between shoulder and waist height (that is at counter height when standing or table height when sitting).     Allergies as of 03/22/2018   No Known Allergies     Medication List    STOP taking these medications   HYDROcodone-acetaminophen 5-325 MG tablet Commonly known as:  NORCO/VICODIN   oxyCODONE-acetaminophen 7.5-325 MG tablet Commonly known as:  PERCOCET Replaced by:  oxyCODONE-acetaminophen 5-325 MG tablet     TAKE these medications   aspirin 81 MG tablet Take 81 mg by mouth daily.   atorvastatin 10 MG tablet Commonly known as:  LIPITOR Take 10 mg by mouth daily.   cyclobenzaprine 10 MG tablet Commonly known as:  FLEXERIL Take 1 tablet (10 mg total) by mouth 3 (three) times daily as needed for muscle spasms.   diclofenac 75 MG EC tablet Commonly known as:  VOLTAREN Take 75 mg by mouth 2 (two) times daily.   gabapentin 300 MG capsule Commonly known as:  NEURONTIN Take 300 mg by mouth 3 (three) times daily.   hydrOXYzine 50 MG tablet Commonly known as:  ATARAX/VISTARIL Take 1 tablet (50 mg total) by mouth every 4 (four) hours as needed for nausea.   lisinopril 20 MG tablet Commonly known  as:  PRINIVIL,ZESTRIL Take 20 mg by mouth daily.   metFORMIN 500 MG 24 hr tablet Commonly known as:  GLUCOPHAGE-XR Take 1,500 mg by mouth every evening.   methocarbamol 750 MG tablet Commonly known as:  ROBAXIN Take 1 tablet (750 mg total) by mouth every 6 (six) hours as needed for muscle spasms.   multivitamin with  minerals Tabs tablet Take 1 tablet by mouth daily.   omeprazole 20 MG capsule Commonly known as:  PRILOSEC Take 20 mg by mouth daily.   oxyCODONE-acetaminophen 5-325 MG tablet Commonly known as:  PERCOCET/ROXICET Take 1-2 tablets by mouth every 4 (four) hours as needed (pain). Replaces:  oxyCODONE-acetaminophen 7.5-325 MG tablet   oxymetazoline 0.05 % nasal spray Commonly known as:  AFRIN Place 1 spray into both nostrils daily as needed for congestion.            Discharge Care Instructions  (From admission, onward)         Start     Ordered   03/22/18 0000  Discharge wound care:    Comments:  Remove dressing the day after discharge, and leave the wound open to air. Shower daily with the wound uncovered. Water and soapy water should run over the incision area. Do not wash directly on the incision for 2 weeks.   03/22/18 0850         Follow-up Information    Shirlean KellyNudelman, Robert, MD Follow up.   Specialty:  Neurosurgery Contact information: [address] [place] [address] [phone]           Signed: Hewitt ShortsUDELMAN,ROBERT W 03/22/2018, 8:50 AM

## 2018-03-22 NOTE — Anesthesia Postprocedure Evaluation (Signed)
Anesthesia Post Note  Patient: Lee BushyJeffrey Jay Kramer  Procedure(s) Performed: RIGHT CERVICAL SEVEN- THORACIC ONE POSTERIOR CERVICAL LAMINOTOMY,FORAMINOTOMY AND MICRODISCECTOMY; BILATERAL CERVICAL SEVEN-THORACIC ONE POSTERIOR CERVICAL ARTHRODESIS (N/A )     Patient location during evaluation: PACU Anesthesia Type: General Level of consciousness: awake and alert Pain management: pain level controlled Vital Signs Assessment: post-procedure vital signs reviewed and stable Respiratory status: spontaneous breathing, nonlabored ventilation, respiratory function stable and patient connected to nasal cannula oxygen Cardiovascular status: blood pressure returned to baseline and stable Postop Assessment: no apparent nausea or vomiting Anesthetic complications: no    Last Vitals:  Vitals:   03/22/18 0501 03/22/18 0502  BP: (!) 92/57 102/65  Pulse: 83 83  Resp:    Temp:    SpO2:      Last Pain:  Vitals:   03/22/18 0608  TempSrc:   PainSc: 5                  Idora Brosious DAVID

## 2018-03-22 NOTE — Discharge Instructions (Signed)
°  Return to Work Will be discussed at you follow up appointment. Call Your Doctor If Any of These Occur Redness, drainage, or swelling at the wound.  Temperature greater than 101 degrees. Severe pain not relieved by pain medication. Increased difficulty swallowing. Incision starts to come apart. Follow Up Appt Call today for appointment in 3 weeks ([phone]) or for problems.  If you have any hardware placed in your spine, you will need an x-ray before your appointment.

## 2018-03-27 ENCOUNTER — Encounter (HOSPITAL_COMMUNITY): Payer: Self-pay | Admitting: Neurosurgery

## 2018-05-18 ENCOUNTER — Encounter (HOSPITAL_COMMUNITY): Payer: Self-pay | Admitting: Neurosurgery

## 2019-02-03 IMAGING — XA DG MYELOGRAPHY LUMBAR INJ CERVICAL
9 series · 9 of 9 positions shown · IV contrast (omnipaque)
Comparison: MR 10/09/2017

CLINICAL DATA: Previous cervical fusion. Right neck and upper
extremity pain.

EXAM:
CERVICAL MYELOGRAM
CT CERVICAL SPINE WITH INTRATHECAL CONTRAST
TECHNIQUE: The procedure, risks (including but not limited to bleeding,
infection, organ damage ), benefits, and alternatives were explained
to the patient. Questions regarding the procedure were encouraged
and answered. The patient understands and consents to the procedure.
An appropriate entry site was determined under fluoroscopy. Operator
donned sterile gloves and mask. Skin site was marked,prepped with
Betadine, and draped in usual sterile fashion, and infiltrated
locally with 1% lidocaine. A 22 gauge spinal needle was advanced
into the thecal sac at L4-5 from a right parasagittal approach.
Clear colorless CSF returned. 10 ml Omnipaque 300 were administered
intrathecally for cervical myelography, followed by axial CT
scanning of the cervical spine. I personally performed the lumbar
puncture and administered the intrathecal contrast. I also
personally supervised acquisition of the myelogram images. Coronal
and sagittal reconstructions were generated.

[Series 1: vasc standard · 1 of 1 slices shown (1 of 9)]
[im 1/1]
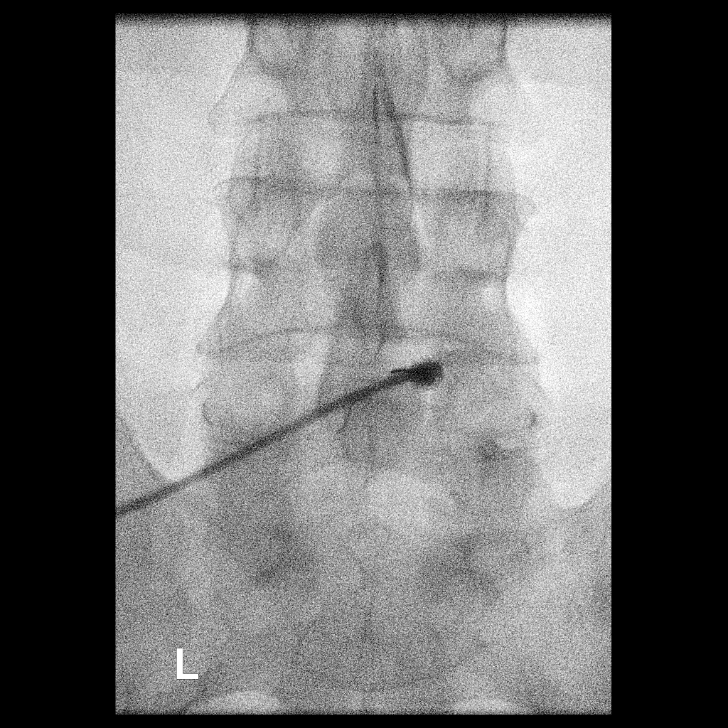

[Series 2: vasc standard · 1 of 1 slices shown (2 of 9)]
[im 1/1]
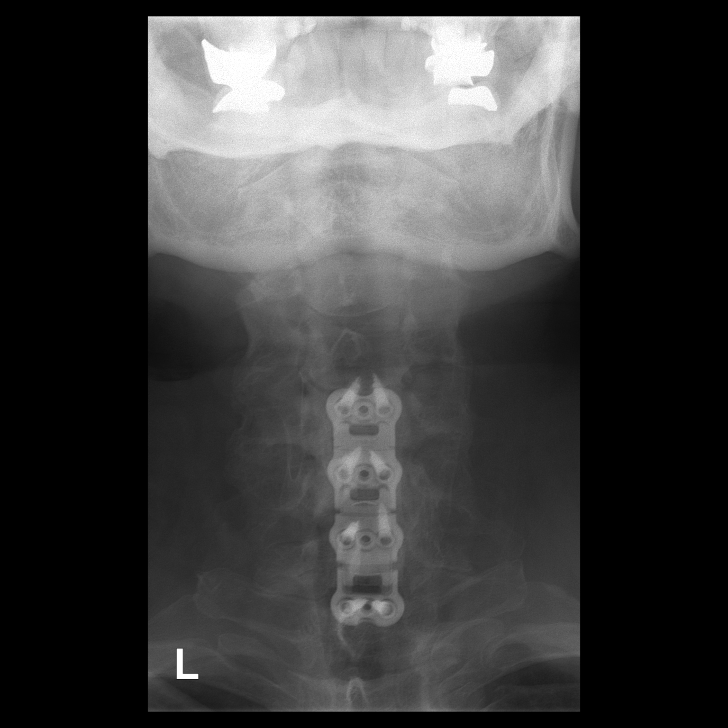

[Series 3: vasc standard · 1 of 1 slices shown (3 of 9)]
[im 1/1]
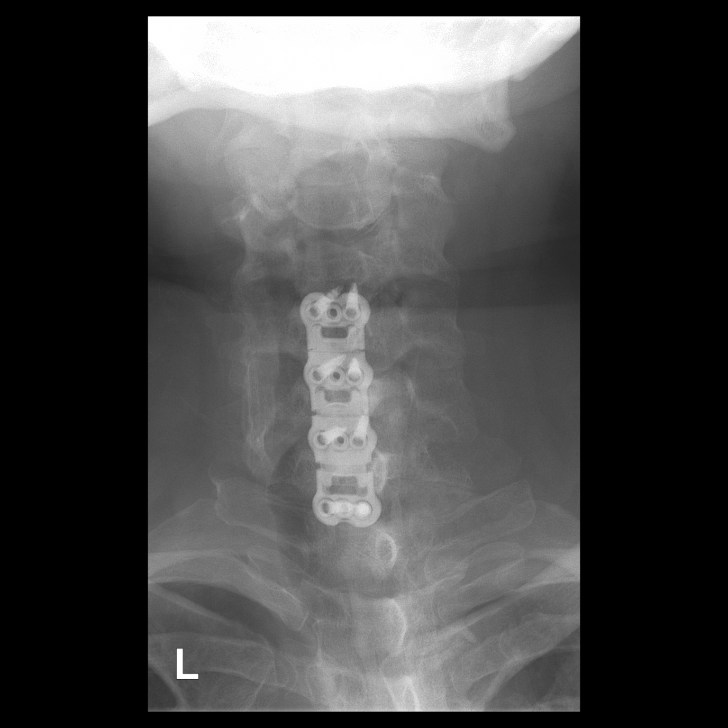

[Series 4: vasc standard · 1 of 1 slices shown (4 of 9)]
[im 1/1]
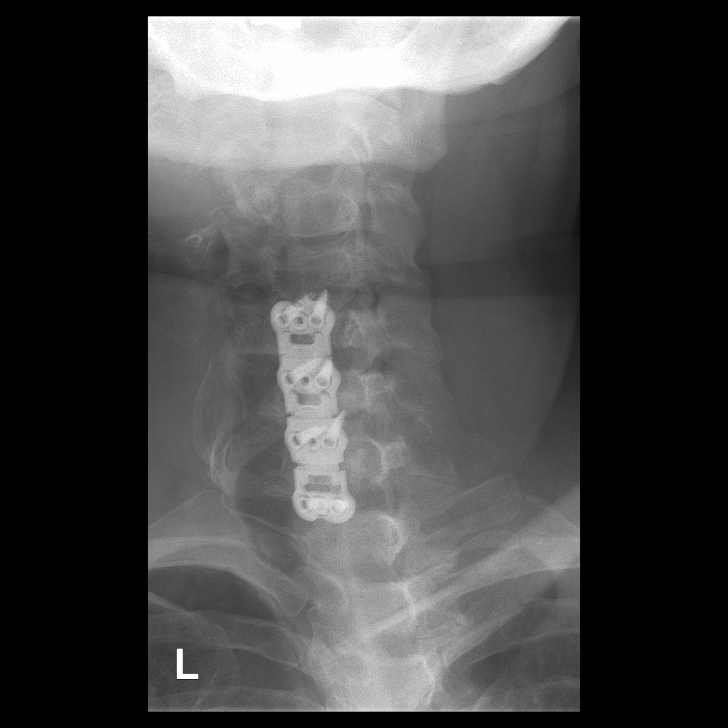

[Series 5: vasc standard · 1 of 1 slices shown (5 of 9)]
[im 1/1]
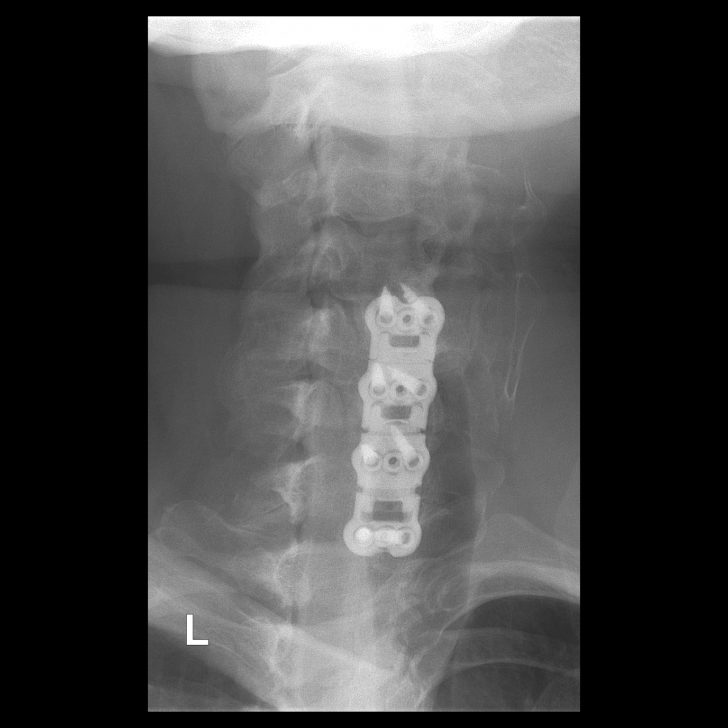

[Series 6: vasc standard · 1 of 1 slices shown (6 of 9)]
[im 1/1]
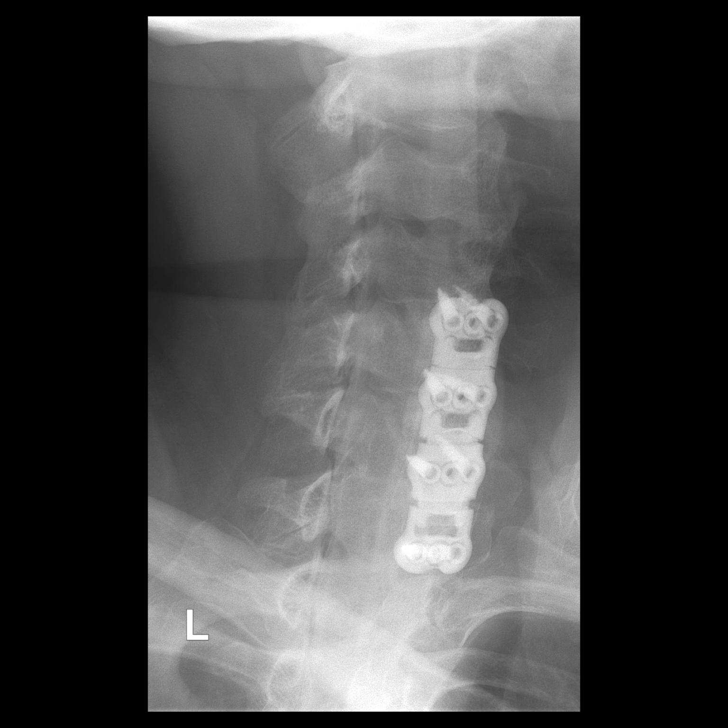

[Series 7: vasc standard · 1 of 1 slices shown (7 of 9)]
[im 1/1]
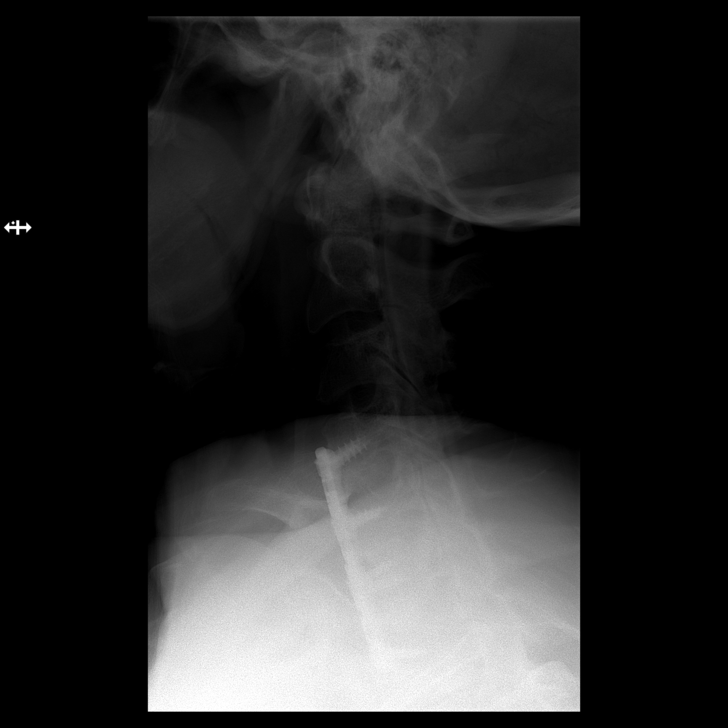

[Series 8: vasc standard · 1 of 1 slices shown (8 of 9)]
[im 1/1]
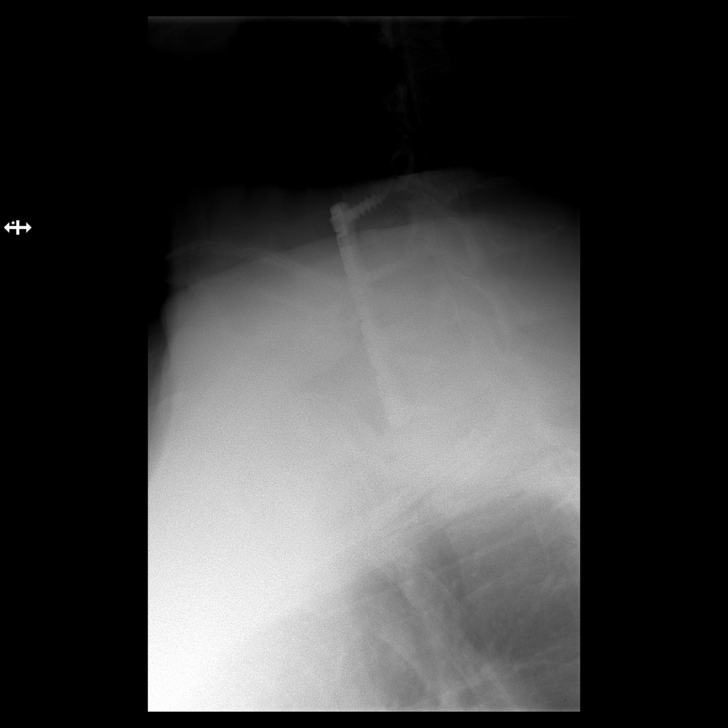

[Series 9: vasc standard · 1 of 1 slices shown (9 of 9)]
[im 1/1]
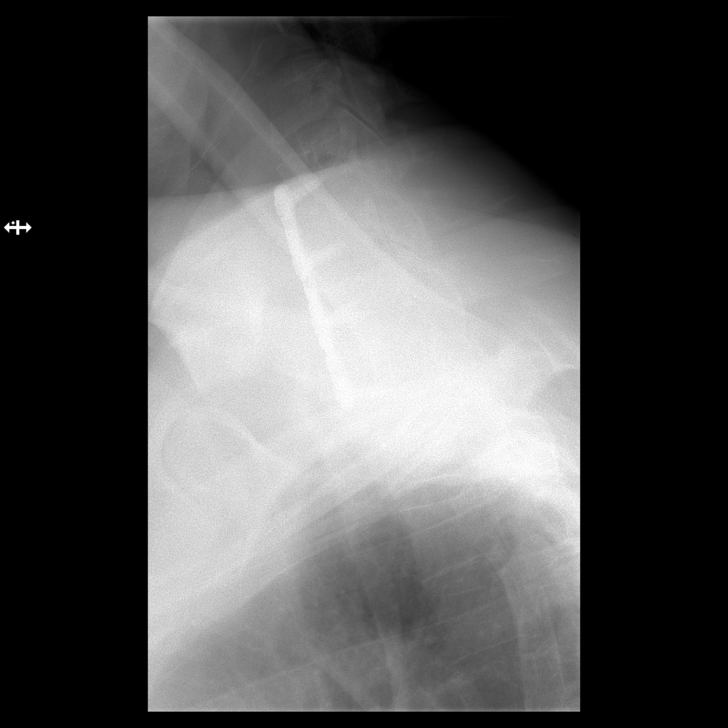

[9 of 9 positions shown; findings below may reference images not displayed]

FINDINGS: Normal alignment.  Negative for fracture.

C2-3: Minimal central disc bulge. No cord flattening or distortion.
Central canal patent. Left facet DJD. Foramina patent.

C3-4: Moderate broad central protrusion. Mild cord flattening and
moderate spinal stenosis. Mild bilateral foraminal encroachment.

C4-5: Instrumented ACDF, hardware intact without surrounding
lucency. Solid-appearing fusion across the interspace. Central canal
patent. Foramina patent.

C5-6: Continuation of ACDF, hardware intact without surrounding
lucency. There is continued lucency across irregular lucency across
the interspace. Central canal remains patent. Foramina patent.

C6-7: Continuation of ACDF, hardware intact without surrounding
lucency. Solid-appearing fusion across the interspace. Central canal
patent. Foramina patent.

C7-T1: Minimal central disc bulge. No cord distortion or flattening.
No spinal stenosis. Foramina patent.

No prevertebral soft tissue swelling. Visualized lung apices
unremarkable.
IMPRESSION: 1. Left facet DJD C2-3.
2. Moderate broad central protrusion C3-4 with cord flattening and
moderate spinal stenosis.
3. ACDF C4-C7 with solid fusion across the C4-5 and C6-7
interspaces, continued linear lucency across the C5-6 interspace.
4. Minimal central bulge C7-T1 without compressive pathology.

## 2019-03-06 IMAGING — RF DG CERVICAL SPINE 1V
1 series · 1 of 1 positions shown · non-contrast
Comparison: CT myelogram dated 01/05/2018

CLINICAL DATA: C3-4 disc protrusion with spinal cord compression.

EXAM:
DG C-ARM 61-120 MIN; DG CERVICAL SPINE - 1 VIEW

[Series 1: run · 1 of 1 slices shown]
[im 1/1]
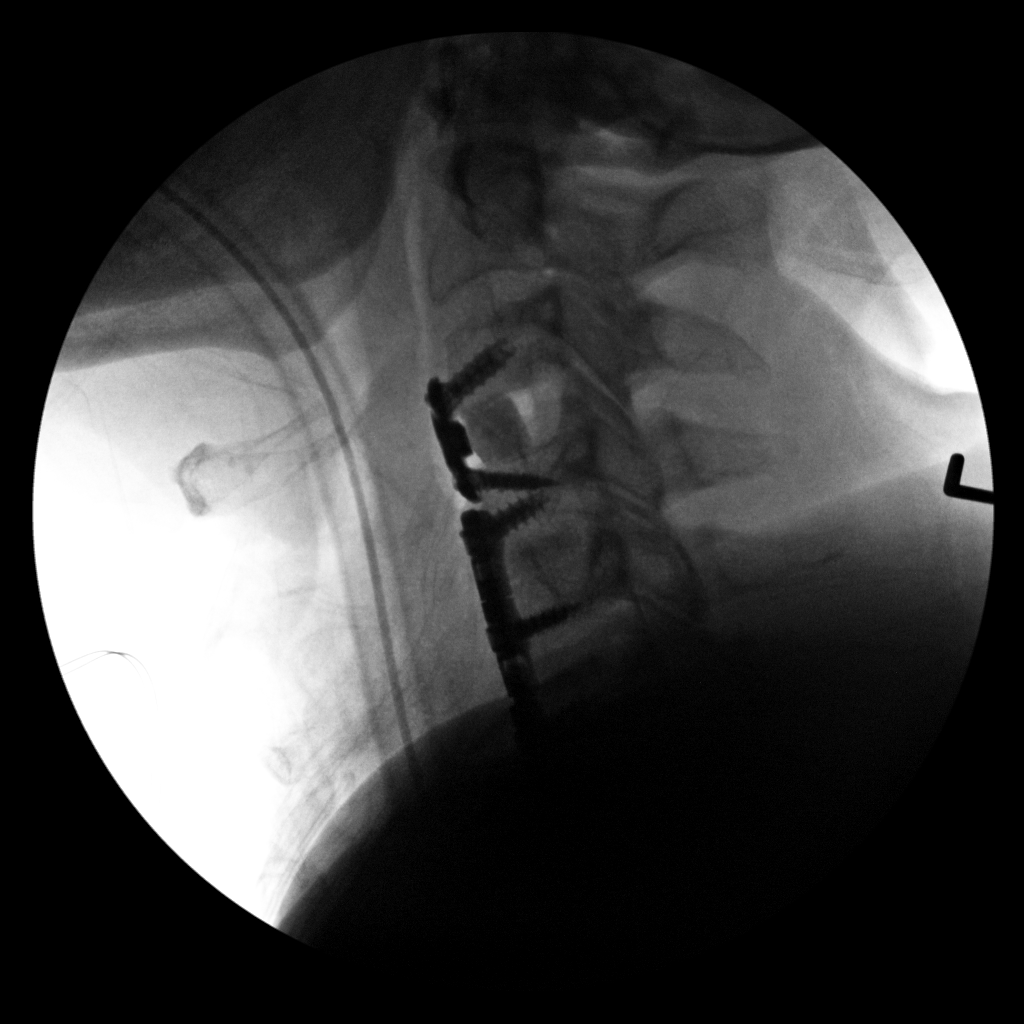

[1 of 1 positions shown; findings below may reference images not displayed]

FINDINGS: Lateral C-arm image demonstrates the patient has undergone anterior
cervical fusion at C3-4. Hardware appears in good position in the
lateral projection. Anatomic alignment. Prior fusions at C4 through
C7.
IMPRESSION: Anterior cervical fusion performed at C3-4.

FLUOROSCOPY TIME:  12 seconds

C-arm fluoroscopic images were obtained intraoperatively and
submitted for post operative interpretation.

## 2019-04-19 IMAGING — RF DG CERVICAL SPINE 2 OR 3 VIEWS
1 series · 2 of 2 positions shown · non-contrast
Comparison: CT 03/13/2018

CLINICAL DATA: 52-year-old male with a history of C7-T1 unilateral
posterior pedicle screw

EXAM:
DG C-ARM 61-120 MIN; CERVICAL SPINE - 2-3 VIEW

[Series 1: run · 2 of 2 slices shown]
[im 1/2]
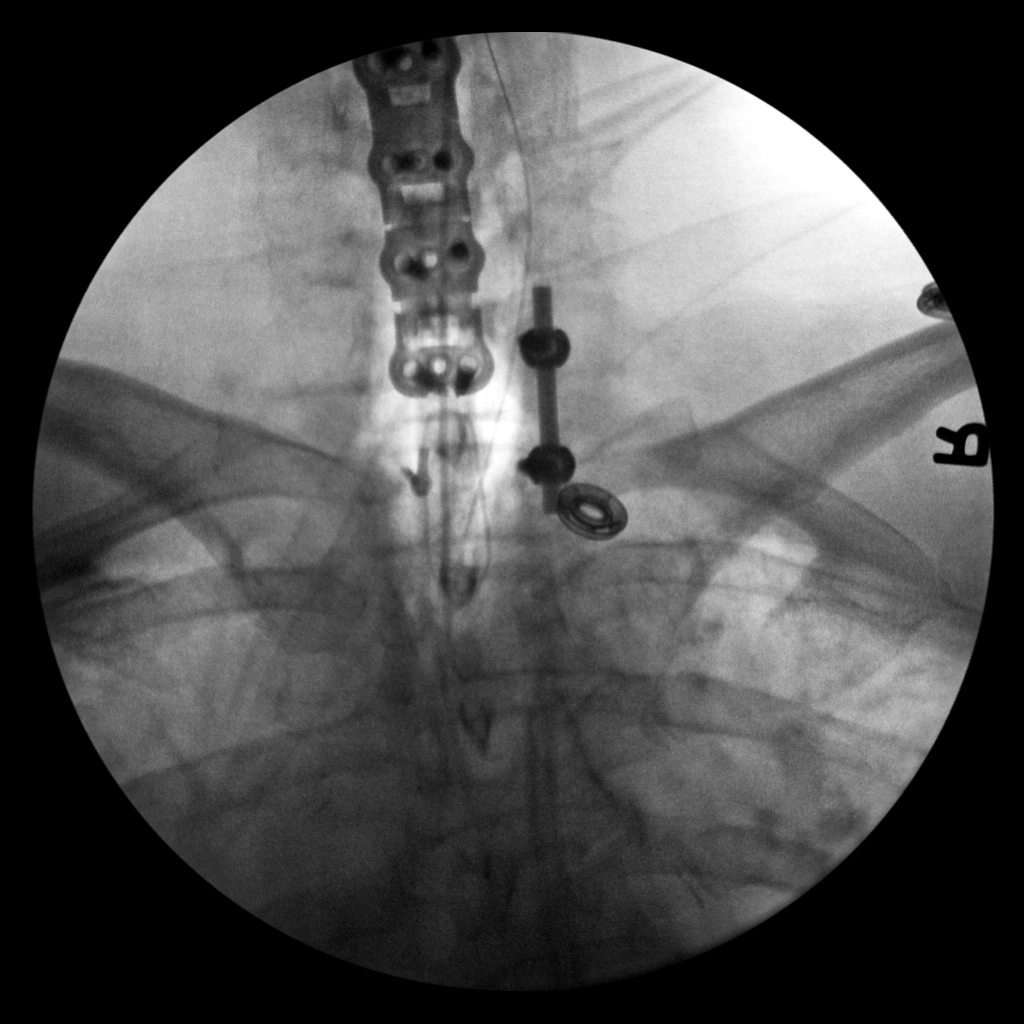
[im 2/2]
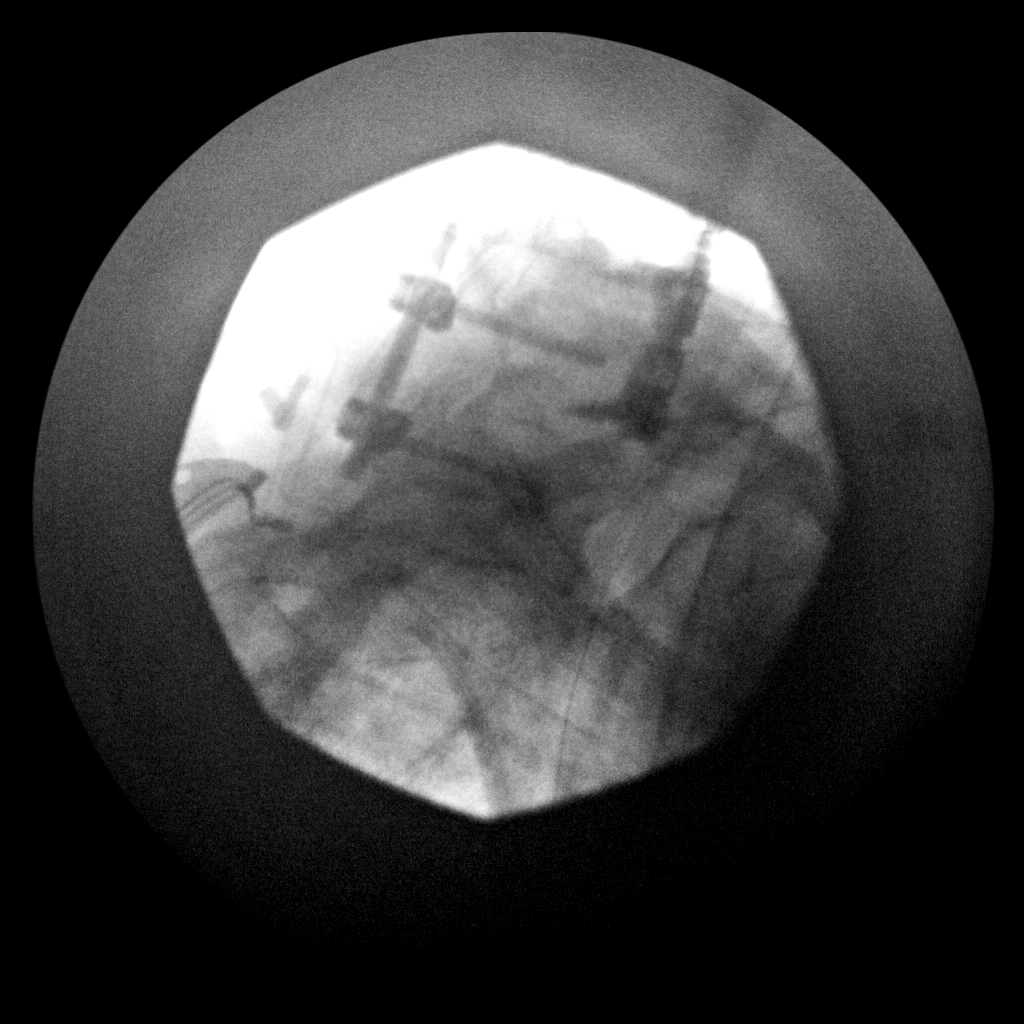

[2 of 2 positions shown; findings below may reference images not displayed]

FINDINGS: Limited intraoperative fluoroscopic spot images of the
cervicothoracic junction.

Incompletely imaged surgical changes of anterior cervical discectomy
and fusion with anterior plate and screw fixation, terminating at
C7. Lateral view demonstrates single level posterior fusion of C7-T1
without complicating features. Pedicle screws are on the right on
the anterior view.
IMPRESSION: Limited images of unilateral posterior fusion of C7-T1. Please refer
to the dictated operative report for full details of intraoperative
findings and procedure.
